# Patient Record
Sex: Male | Born: 1970 | Race: White | Hispanic: No | Marital: Married | Smoking: Current every day smoker
Health system: Southern US, Community
[De-identification: ages and names within clinical notes are randomized; demographics above are authoritative.]

## PROBLEM LIST (undated history)

## (undated) DIAGNOSIS — C61 Malignant neoplasm of prostate: Secondary | ICD-10-CM

## (undated) DIAGNOSIS — N35919 Unspecified urethral stricture, male, unspecified site: Secondary | ICD-10-CM

## (undated) DIAGNOSIS — M5430 Sciatica, unspecified side: Secondary | ICD-10-CM

## (undated) HISTORY — PX: PROSTATE BIOPSY: SHX241

---

## 2017-03-04 ENCOUNTER — Encounter: Payer: Self-pay | Admitting: Radiation Oncology

## 2017-03-15 ENCOUNTER — Encounter: Payer: Self-pay | Admitting: Radiation Oncology

## 2017-03-15 NOTE — Progress Notes (Signed)
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 Pain improves with activity. Taking oxycontin/oxycodone with moderate effect.   If Spine Met(s), symptoms, if any, include:  Bowel/Bladder retention or incontinence (please describe): Self caths 5 x per day, incomplete emptying, frequency, weak stream, straining, nocturia and occasional hematuria. ED related to ADT.  Numbness or weakness in extremities (please describe):   Current Decadron regimen, if applicable:   Ambulatory status? Walker? Wheelchair?: Ambulatory  SAFETY ISSUES:  Prior radiation? no  Pacemaker/ICD? no  Possible current pregnancy? no  Is the patient on methotrexate? no  Current Complaints / other details:  46 year old male. 4 kids. Married. Current everyday smoker.

## 2017-03-18 ENCOUNTER — Ambulatory Visit
Admission: RE | Admit: 2017-03-18 | Discharge: 2017-03-18 | Disposition: A | Discharge status: Home / Self Care | Payer: 59 | Source: Ambulatory Visit | Attending: Radiation Oncology | Admitting: Radiation Oncology

## 2017-03-18 ENCOUNTER — Inpatient Hospital Stay
Admission: RE | Admit: 2017-03-18 | Discharge: 2017-03-18 | Disposition: A | Discharge status: Home / Self Care | Payer: 59 | Source: Ambulatory Visit | Attending: Radiation Oncology | Admitting: Radiation Oncology

## 2017-03-18 ENCOUNTER — Ambulatory Visit: Payer: 59

## 2017-03-18 ENCOUNTER — Telehealth: Payer: Self-pay | Admitting: Radiation Oncology

## 2017-03-18 HISTORY — DX: Malignant neoplasm of prostate: C61

## 2017-03-18 HISTORY — DX: Unspecified urethral stricture, male, unspecified site: N35.919

## 2017-03-18 HISTORY — DX: Sciatica, unspecified side: M54.30

## 2017-03-18 NOTE — Progress Notes (Signed)
No Show

## 2017-03-18 NOTE — Telephone Encounter (Signed)
Patient did not show for 0930 nurse evaluation prior to consult. Phoned patient several times. No answer any of those times. After the third call I left a message requesting a return call to reschedule. Provided my contact information. Informed Dr. Tammi Klippel of such.

## 2017-03-28 ENCOUNTER — Telehealth: Payer: Self-pay | Admitting: Radiation Oncology

## 2017-03-28 NOTE — Telephone Encounter (Signed)
Kevin patient today to inquire about rescheduling missed consult appointment. Patient reports he hasn't followed up with Dr. Dwana Curd yet and until he does so he doesn't want to reschedule. Assured patient we would like to help. Provided my contact information should he like to reschedule.

## 2017-03-28 NOTE — Telephone Encounter (Signed)
-----   Message from Tyler Pita, MD sent at 03/28/2017 12:23 PM EDT ----- Has anyone rescheduled this no-show?  (Please reply and put an update in EPIC on the status)

## 2017-03-28 NOTE — Telephone Encounter (Signed)
Thanks Sam, We can wait and reschedule if he or Dr. Dwana Curd calls back. MM

## 2017-10-16 ENCOUNTER — Encounter: Payer: Self-pay | Admitting: Radiation Oncology

## 2017-10-22 ENCOUNTER — Other Ambulatory Visit: Payer: Self-pay | Admitting: Radiation Oncology

## 2017-10-22 ENCOUNTER — Ambulatory Visit
Admission: RE | Admit: 2017-10-22 | Discharge: 2017-10-22 | Disposition: A | Discharge status: Home / Self Care | Payer: Self-pay | Source: Ambulatory Visit | Attending: Radiation Oncology | Admitting: Radiation Oncology

## 2017-10-22 DIAGNOSIS — C61 Malignant neoplasm of prostate: Secondary | ICD-10-CM

## 2017-10-24 ENCOUNTER — Other Ambulatory Visit: Payer: Self-pay

## 2017-10-24 ENCOUNTER — Encounter: Payer: Self-pay | Admitting: Radiation Oncology

## 2017-10-24 ENCOUNTER — Ambulatory Visit
Admission: RE | Admit: 2017-10-24 | Discharge: 2017-10-24 | Disposition: A | Discharge status: Home / Self Care | Payer: 59 | Source: Ambulatory Visit | Attending: Radiation Oncology | Admitting: Radiation Oncology

## 2017-10-24 ENCOUNTER — Encounter: Payer: Self-pay | Admitting: Medical Oncology

## 2017-10-24 VITALS — BP 146/73 | HR 70 | Temp 98.2°F | Resp 18 | Wt 216.4 lb

## 2017-10-24 DIAGNOSIS — R200 Anesthesia of skin: Secondary | ICD-10-CM | POA: Diagnosis not present

## 2017-10-24 DIAGNOSIS — F17210 Nicotine dependence, cigarettes, uncomplicated: Secondary | ICD-10-CM | POA: Insufficient documentation

## 2017-10-24 DIAGNOSIS — M25552 Pain in left hip: Secondary | ICD-10-CM | POA: Insufficient documentation

## 2017-10-24 DIAGNOSIS — Z79899 Other long term (current) drug therapy: Secondary | ICD-10-CM | POA: Insufficient documentation

## 2017-10-24 DIAGNOSIS — Z9221 Personal history of antineoplastic chemotherapy: Secondary | ICD-10-CM | POA: Insufficient documentation

## 2017-10-24 DIAGNOSIS — R102 Pelvic and perineal pain: Secondary | ICD-10-CM | POA: Insufficient documentation

## 2017-10-24 DIAGNOSIS — Z801 Family history of malignant neoplasm of trachea, bronchus and lung: Secondary | ICD-10-CM | POA: Insufficient documentation

## 2017-10-24 DIAGNOSIS — C7951 Secondary malignant neoplasm of bone: Secondary | ICD-10-CM | POA: Insufficient documentation

## 2017-10-24 DIAGNOSIS — Z8546 Personal history of malignant neoplasm of prostate: Secondary | ICD-10-CM | POA: Diagnosis not present

## 2017-10-24 DIAGNOSIS — C61 Malignant neoplasm of prostate: Secondary | ICD-10-CM

## 2017-10-24 NOTE — Progress Notes (Signed)
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 Malignant neoplasm of prostate ([place]) C61   2. Prostate cancer metastatic to bone [facility]) C61    C79.51     HISTORY OF PRESENT ILLNESS: Kevin Hurley is a 47 y.o. male seen at the request of Dr. Aline Brochure for metastatic prostate cancer. The patient was initially diagnosed with metastatic prostate cancer in 2016 when he presented to ED at [facility] on 12/16/14 for pelvic pain and urinary retention. Per patient report, he was found to have severe urethral stricture, attributing to the BOO sxs.  He underwent a bone scan which revealed uptake at the right [facility] joint, right T8, L3-4 and left ilium. He underwent a transurethral resection of the prostate on 12/20/14 which showed Gleason's 4+5 pro acinar adenocarcinoma. The patient was started on ADT with CAB in June 2016. His PSA at this time was 0.33 and a repeat PSA in 01/2015 was 0.13. He completed 6 cycles of Docetaxel on 04/19/15 and by 05/30/15, his PSA was less than 0.03. The PSA gradually increased to 0.06 on 10/21/15 despite continuing on CAB with Lupron 22.5 mg every 3 months and Casodex 50 mg daily. On 01/24/16, the PSA increased to 0.11 and abiraterone 1000 mg+ prednisone 5 mg was added to his regimen.  A CT abdomen pelvis and bone scan in June 2018 shows disease progression with increased size of a left pulmonary nodule as well as a new left posterior bladder focus which was enhancing and increased osseous metastatic disease.  He started Docetaxel x 10 cycles in 03/15/2017 with PSA 1.85.  He met with Dr. Dwana Curd in radiation oncology on  02/28/17 who recommended 30 Gy in 10 fractions to the left pelvis over 2 weeks and 50 Gy to the prostate over the course of 4 weeks, after chemo is completed.  PSA 05/17/17 was 1.17 but back up to 1.29 06/07/17.  He completed 10 cycles of Docetaxel on 09/20/17.  Repeat systemic imaging 10/11/17 demonstrated overall disease stability aside for a slightly enlarged LLL pulmonary nodule.  He has continued with persistent and progressive pain in the left hip/groin but denies other specific sites of bony pain.  The patient has been referred by Dr. Aline Brochure for consideration of 30 Gy to the left pelvis over 2 weeks and 50 Gy in 4 weeks to the prostate after debulking TURP.  He is scheduled to meet with Dr. Arlyn Leak, [facility], on 11/25/2017.  He has a history of urethral stricture requiring daily CIC.  His LUTS have actually improved since completion of chemotherapy and he is now self catheterizing less frequently.  His fear at this point is that proceeding with repeat TURP might potentially cause bladder incontinence which would be a deal breaker for him as he would much rather deal with CIC prn.  Nonetheless, he is still planning to meet with Dr. Arlyn Leak to discuss debulking TURP and get his expert opinion regarding the risks and potential side effects associated with this treatment.  The patient currently complains of persistent left hip/pelvic pain since diagnosis. This is worse with sitting. He notes that this improves somewhat with  activity. He is taking OxyContin/Oxycodone with moderate effect. He grades his left hip pain as 3/10. As of 10/14/17 his dose of Prednisone has increased from 5 mg to 10 mg.  PREVIOUS RADIATION THERAPY: No  PAST MEDICAL HISTORY:  Past Medical History:  Diagnosis Date  . Prostate cancer ([place])   . Sciatica   . Urethral stricture       PAST SURGICAL HISTORY: Past Surgical History:  Procedure Laterality Date  . PROSTATE BIOPSY      FAMILY HISTORY:  Family History    Problem Relation Age of Onset  . Cancer Father        lung cancer/quit smoking numerous years before diagnosis  . Healthy Brother   . Cancer Maternal Aunt        brain cancer  . Cancer Maternal Grandfather        throat cancer/smoker    SOCIAL HISTORY:  Social History   Socioeconomic History  . Marital status: Married    Spouse name: Not on file  . Number of children: 4  . Years of education: Not on file  . Highest education level: Not on file  Occupational History  . Occupation: [employer]    Comment: Tree surgeon  Social Needs  . Financial resource strain: Not on file  . Food insecurity:    Worry: Not on file    Inability: Not on file  . Transportation needs:    Medical: Not on file    Non-medical: Not on file  Tobacco Use  . Smoking status: Current Every Day Smoker    Packs/day: 1.00    Years: 20.00    Pack years: 20.00    Types: Cigarettes  . Smokeless tobacco: Never Used  Substance and Sexual Activity  . Alcohol use: Not Currently  . Drug use: No  . Sexual activity: Never  Lifestyle  . Physical activity:    Days per week: Not on file    Minutes per session: Not on file  . Stress: Not on file  Relationships  . Social connections:    Talks on phone: Not on file    Gets together: Not on file    Attends religious service: Not on file    Active member of club or organization: Not on file    Attends meetings of clubs or organizations: Not on file    Relationship status: Not on file  . Intimate partner violence:    Fear of current or ex partner: Not on file    Emotionally abused: Not on file    Physically abused: Not on file    Forced sexual activity: Not on file  Other Topics Concern  . Not on file  Social History Narrative   Resides in [place], [place] at the [employer]. From [place]. Oldest son is an Chief Financial Officer who lives in [place] with his wife and son. Patient has four children total. 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     ALLERGIES:  Other  MEDICATIONS:  Current Outpatient Medications  Medication Sig Dispense Refill  . loratadine (CLARITIN) 10 MG tablet Take 10 mg by mouth daily.    Marland Kitchen oxyCODONE (OXYCONTIN) 10 mg 12 hr tablet Take 10 mg by mouth every 12 (twelve) hours.    . Oxycodone HCl 10 MG TABS TAKE 1 TABLET (10 MG TOTAL) BY MOUTH EVERY 4 (FOUR) HOURS AS NEEDED FOR PAIN  0  . predniSONE (DELTASONE) 10 MG tablet Take 10 mg by mouth daily with breakfast.    .  ranitidine (ZANTAC) 150 MG tablet Take by mouth.    . tamsulosin (FLOMAX) 0.4 MG CAPS capsule Take 0.4 mg by mouth.     No current facility-administered medications for this encounter.     REVIEW OF SYSTEMS:  On review of systems, the patient reports that he is doing well overall. He denies any chest pain, shortness of breath, cough, fevers, chills, night sweats, unintended weight changes. He reports incomplete emptying, frequency, weak stream, rare straining, and nocturia. He denies any bowel disturbances, and denies abdominal pain, nausea or vomiting. He reports persistent pelvic pain and pain ot the left hip. He also complains of occasional numbness in the left calf. A complete review of systems is obtained and is otherwise negative.    PHYSICAL EXAM:  Wt Readings from Last 3 Encounters:  10/24/17 216 lb 6.4 oz (98.2 kg)   Temp Readings from Last 3 Encounters:  10/24/17 98.2 F (36.8 C) (Oral)   BP Readings from Last 3 Encounters:  10/24/17 (!) 146/73   Pulse Readings from Last 3 Encounters:  10/24/17 70   Pain Assessment Pain Score: 3  Pain Loc: Hip/10  In general this is a well appearing caucasian gentleman in no acute distress. He is alert and oriented x4 and appropriate throughout the examination. HEENT reveals that the patient is normocephalic, atraumatic. EOMs are intact. PERRLA. Skin is intact without any evidence of gross lesions. Cardiovascular exam reveals a regular rate and rhythm, no clicks rubs or murmurs are auscultated. Chest is clear  to auscultation bilaterally. Lymphatic assessment is performed and does not reveal any adenopathy in the cervical, supraclavicular, axillary, or inguinal chains. Abdomen has active bowel sounds in all quadrants and is intact. The abdomen is soft, non tender, non distended. Lower extremities are negative for pretibial pitting edema, deep calf tenderness, cyanosis or clubbing.   KPS = 100  100 - Normal; no complaints; no evidence of disease. 90   - Able to carry on normal activity; minor signs or symptoms of disease. 80   - Normal activity with effort; some signs or symptoms of disease. 2   - Cares for self; unable to carry on normal activity or to do active work. 60   - Requires occasional assistance, but is able to care for most of his personal needs. 50   - Requires considerable assistance and frequent medical care. 67   - Disabled; requires special care and assistance. 10   - Severely disabled; hospital admission is indicated although death not imminent. 21   - Very sick; hospital admission necessary; active supportive treatment necessary. 10   - Moribund; fatal processes progressing rapidly. 0     - Dead  Karnofsky DA, Abelmann WH, Craver LS and Burchenal JH [phone]) The use of the nitrogen mustards in the palliative treatment of carcinoma: with particular reference to bronchogenic carcinoma Cancer 1 [id number]  LABORATORY DATA:  No results found for: WBC, HGB, HCT, MCV, PLT No results found for: NA, K, CL, CO2 No results found for: ALT, AST, GGT, ALKPHOS, BILITOT   RADIOGRAPHY: No results found.    IMPRESSION/PLAN: 1. 47 y.o. gentleman with diffusly metastatic prostate cancer with painful metastasis to the left hip.  Today, we talked to the patient about the findings and work-up thus far. We discussed the natural history of metastatic prostate cancer and general treatment, highlighting the role of radiotherapy in the management. We discussed the course of palliative radiotherapy to the left  hip now , and consider a course of  4 weeks palliative radiotherapy to the prostate following TURP for debulking should he opt to proceed with surgical debulking.  We would offer palliative radiotherapy to the prostate regardless of whether he decides to proceed with TURP or not and certainly encourage him to proceed with surgical debulking as this will increase the likelihood of getting more durable control of the prostate cancer.  We discussed the available radiation techniques, and focused on the details of logistics and delivery. We reviewed the anticipated acute and late sequelae associated with radiation in this setting. The patient was encouraged to ask questions that were answered to the best of our ability. The patient will meet with Dr. Arlyn Leak at [facility] to decide if he will proceed with TURP prior to radiation.   At the conclusion of our conversation, the patient elects to proceed with palliative radiation to the left hip and will be scheduled for CT simulation at 1pm on Friday 10/25/17. He has freely signed written consent to proceed today in the office and a copy of this document is placed in his medical record. The recommendation is for 30 Gy in 10 fractions to the left hip delivered over the course of 2 weeks and we anticipate beginning treatment Monday, 10/28/17.     We will plan to meet back with him in the office following his meeting with Dr. Arlyn Leak and will make further arrangements for palliative prostate radiotherapy accordingly at that time pending his decision to move forward with TURP.  I spent 60 minutes minutes face to face with the patient and more than 50% of that time was spent in counseling and/or coordination of care.    Nicholos Johns, PA-C    Tyler Pita, MD  [facility] Direct Dial: [phone]  Fax: [phone] [email or web address]  Skype  [email or web address]   This document serves as a record of services personally performed by Tyler Pita, MD and  Freeman Caldron, PA-C. It was created on their behalf by Bethann Humble, a trained medical scribe. The creation of this record is based on the scribe's personal observations and the provider's statements to them. This document has been checked and approved by the attending provider.

## 2017-10-24 NOTE — Progress Notes (Signed)
Diagnosed in 2016 in [place]. He received treatment with Dr. Marlowe Sax. He relocated to [facility] 2017 and is currently under the care of Dr. Lindaann Slough. He was referred here for radiation with Dr. Tammi Klippel.

## 2017-10-24 NOTE — Progress Notes (Signed)
Introduced myself to Mr. Kevin Hurley as the prostate nurse navigator and my role. He informs me he was diagnosed with prostate cancer in 2016 and has received chemo and Lupron. He completed 10 cycles of chemo in February and is looking forward to getting his energy back. He shared his feelings and frustration of chemo, fatigue, hair loss, and now facing radiation.He also voiced concerns over having a TURP and being incontinent. He is ready to move forward with radiation and hopes to be able to return to playing tennis in the future. I informed him of our support services here at [facility] that can help support him thru his journey. I gave him my card and asked him to call me with questions or concerns.

## 2017-10-24 NOTE — Progress Notes (Signed)
Histology and Location of Primary Cancer: metastatic prostate cancer  Sites of Visceral and Bony Metastatic Disease: R AC joint, T8, L3-4, left ilium  Location(s) of Symptomatic Metastases: left ilium  Past/Anticipated chemotherapy by medical oncology, if any: 10 cycles of docetaxel (last one 5 weeks ago/end of February). Lupron 22.5 given 09/20/2017.  Pain on a scale of 0-10 is: Left hip, 3/10 and aching. He notes persistent pelvic pain since diagnosis particularly with sitting. He reports the pain improves with activity. He takes oxycontin/oxycodone with moderate effect. He still works and plays tennis despite pain???    If Spine Met(s), symptoms, if any, include:  Bowel/Bladder retention or incontinence (please describe): not having to cath himself as frequently at night since chemotherapy. Currently taking Flomax. Reports incomplete emptying, frequency, weak stream, straining (rarely), nocturia. Denies hematuria x several months.  Numbness or weakness in extremities (please describe): reports occasional numbness in left calf  Current Decadron regimen, if applicable: increased dose of prednisone to 10 mg daily from 5 mg on 10/14/2017.  Ambulatory status? Walker? Wheelchair?: Ambulatory  SAFETY ISSUES:  Prior radiation? no  Pacemaker/ICD? no  Possible current pregnancy? no  Is the patient on methotrexate? no  Current Complaints / other details:  47 year old male. Married. 4 children (all four are male). 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 Patient's father with hx of lung ca and maternal grandfather with hx of throat ca.  PSA increasing. Most recent PSA 3.95 up from 3.07.  Referred to Dr. Tammi Klippel from Dr. Aline Brochure for consideration of 30 Gy to the left pelvis over 2 weeks and 50 Gy in 4 weeks to the prostate after TURP/chemotherapy is complete. Patient reports he will not have a TURP due to fear of incontinence. Reports fatigue. Expresses frustration with  alopecia.

## 2017-10-25 ENCOUNTER — Ambulatory Visit
Admission: RE | Admit: 2017-10-25 | Discharge: 2017-10-25 | Disposition: A | Discharge status: Home / Self Care | Payer: 59 | Source: Ambulatory Visit | Attending: Radiation Oncology | Admitting: Radiation Oncology

## 2017-10-25 DIAGNOSIS — C7951 Secondary malignant neoplasm of bone: Secondary | ICD-10-CM | POA: Insufficient documentation

## 2017-10-25 DIAGNOSIS — C61 Malignant neoplasm of prostate: Secondary | ICD-10-CM | POA: Insufficient documentation

## 2017-10-25 NOTE — Progress Notes (Deleted)
Received patient in the clinic following SRS treatment. Will monitor patient for a minimum of thirty minutes. Patient alert and oriented x 3. No distress noted. Patient denies headache, dizziness, nausea, diplopia or ringing in the ears. Patient confirms he is taking prednisone 10 mg once daily. No evidence of thrush noted in his mouth. Patient understands to avoid strenuous activity for the next 24 hours. Patient understands to phone [phone] and request the radiation oncologist on call if he experiences any neurologic changes over the weekend. Patient discharged home. Ambulatory with the aid of a cane and in no distress.  BP 135/80 (BP Location: Right Arm, Patient Position: Sitting, Cuff Size: Normal)   Pulse 74   Temp 98.1 F (36.7 C) (Oral)   Resp 18   SpO2 100%  .kh                

## 2017-10-27 NOTE — Progress Notes (Addendum)
  Radiation Oncology         (336) [phone] ________________________________  Name: Kevin Hurley MRN: [id number]  Date: 10/25/2017  DOB: 03/08/1971  SIMULATION AND TREATMENT PLANNING NOTE    ICD-10-CM   1. Prostate cancer metastatic to bone [facility]) C61    C79.51     DIAGNOSIS:  47 year old gentleman with diffusely metastatic prostate cancer with painful bony metastasis to the left hip  NARRATIVE:  The patient was brought to the [facility].  Identity was confirmed.  All relevant records and images related to the planned course of therapy were reviewed.  The patient freely provided informed written consent to proceed with treatment after reviewing the details related to the planned course of therapy. The consent form was witnessed and verified by the simulation staff.  Then, the patient was set-up in a stable reproducible  supine position for radiation therapy.  CT images were obtained.  Surface markings were placed.  The CT images were loaded into the planning software.  Then the target and avoidance structures were contoured.  Treatment planning then occurred.  The radiation prescription was entered and confirmed.  Then, I designed and supervised the construction of a total of 3 medically necessary complex treatment devices BodyFix positioner and 2 MLC's.  I have requested : Isodose Plan.   PLAN:  The patient will receive 30 Gy in 10 fractions.  ________________________________  Sheral Apley Tammi Klippel, M.D.

## 2017-10-28 DIAGNOSIS — C61 Malignant neoplasm of prostate: Secondary | ICD-10-CM | POA: Insufficient documentation

## 2017-10-28 DIAGNOSIS — Z510 Encounter for antineoplastic radiation therapy: Secondary | ICD-10-CM | POA: Insufficient documentation

## 2017-10-29 DIAGNOSIS — Z510 Encounter for antineoplastic radiation therapy: Secondary | ICD-10-CM | POA: Diagnosis not present

## 2017-10-29 DIAGNOSIS — C61 Malignant neoplasm of prostate: Secondary | ICD-10-CM | POA: Diagnosis present

## 2017-10-31 ENCOUNTER — Ambulatory Visit
Admission: RE | Admit: 2017-10-31 | Discharge: 2017-10-31 | Disposition: A | Discharge status: Home / Self Care | Payer: 59 | Source: Ambulatory Visit | Attending: Radiation Oncology | Admitting: Radiation Oncology

## 2017-10-31 ENCOUNTER — Encounter: Payer: Self-pay | Admitting: Medical Oncology

## 2017-10-31 DIAGNOSIS — C61 Malignant neoplasm of prostate: Secondary | ICD-10-CM | POA: Diagnosis not present

## 2017-11-01 ENCOUNTER — Ambulatory Visit
Admission: RE | Admit: 2017-11-01 | Discharge: 2017-11-01 | Disposition: A | Discharge status: Home / Self Care | Payer: 59 | Source: Ambulatory Visit | Attending: Radiation Oncology | Admitting: Radiation Oncology

## 2017-11-01 DIAGNOSIS — C61 Malignant neoplasm of prostate: Secondary | ICD-10-CM | POA: Diagnosis not present

## 2017-11-04 ENCOUNTER — Ambulatory Visit
Admission: RE | Admit: 2017-11-04 | Discharge: 2017-11-04 | Disposition: A | Discharge status: Home / Self Care | Payer: 59 | Source: Ambulatory Visit | Attending: Radiation Oncology | Admitting: Radiation Oncology

## 2017-11-04 ENCOUNTER — Ambulatory Visit: Payer: 59 | Admitting: Radiation Oncology

## 2017-11-04 ENCOUNTER — Ambulatory Visit: Payer: 59

## 2017-11-04 DIAGNOSIS — C61 Malignant neoplasm of prostate: Secondary | ICD-10-CM | POA: Diagnosis not present

## 2017-11-05 ENCOUNTER — Ambulatory Visit
Admission: RE | Admit: 2017-11-05 | Discharge: 2017-11-05 | Disposition: A | Discharge status: Home / Self Care | Payer: 59 | Source: Ambulatory Visit | Attending: Radiation Oncology | Admitting: Radiation Oncology

## 2017-11-05 DIAGNOSIS — C61 Malignant neoplasm of prostate: Secondary | ICD-10-CM | POA: Diagnosis not present

## 2017-11-06 ENCOUNTER — Ambulatory Visit
Admission: RE | Admit: 2017-11-06 | Discharge: 2017-11-06 | Disposition: A | Discharge status: Home / Self Care | Payer: 59 | Source: Ambulatory Visit | Attending: Radiation Oncology | Admitting: Radiation Oncology

## 2017-11-06 DIAGNOSIS — C61 Malignant neoplasm of prostate: Secondary | ICD-10-CM | POA: Diagnosis not present

## 2017-11-07 ENCOUNTER — Ambulatory Visit
Admission: RE | Admit: 2017-11-07 | Discharge: 2017-11-07 | Disposition: A | Discharge status: Home / Self Care | Payer: 59 | Source: Ambulatory Visit | Attending: Radiation Oncology | Admitting: Radiation Oncology

## 2017-11-07 DIAGNOSIS — C61 Malignant neoplasm of prostate: Secondary | ICD-10-CM | POA: Diagnosis not present

## 2017-11-08 ENCOUNTER — Ambulatory Visit
Admission: RE | Admit: 2017-11-08 | Discharge: 2017-11-08 | Disposition: A | Discharge status: Home / Self Care | Payer: 59 | Source: Ambulatory Visit | Attending: Radiation Oncology | Admitting: Radiation Oncology

## 2017-11-08 DIAGNOSIS — C61 Malignant neoplasm of prostate: Secondary | ICD-10-CM | POA: Diagnosis not present

## 2017-11-11 ENCOUNTER — Ambulatory Visit
Admission: RE | Admit: 2017-11-11 | Discharge: 2017-11-11 | Disposition: A | Discharge status: Home / Self Care | Payer: 59 | Source: Ambulatory Visit | Attending: Radiation Oncology | Admitting: Radiation Oncology

## 2017-11-11 DIAGNOSIS — C61 Malignant neoplasm of prostate: Secondary | ICD-10-CM | POA: Diagnosis not present

## 2017-11-12 ENCOUNTER — Ambulatory Visit
Admission: RE | Admit: 2017-11-12 | Discharge: 2017-11-12 | Disposition: A | Discharge status: Home / Self Care | Payer: 59 | Source: Ambulatory Visit | Attending: Radiation Oncology | Admitting: Radiation Oncology

## 2017-11-12 DIAGNOSIS — C61 Malignant neoplasm of prostate: Secondary | ICD-10-CM | POA: Diagnosis not present

## 2017-11-13 ENCOUNTER — Encounter: Payer: Self-pay | Admitting: Medical Oncology

## 2017-11-13 ENCOUNTER — Encounter: Payer: Self-pay | Admitting: Radiation Oncology

## 2017-11-13 ENCOUNTER — Ambulatory Visit
Admission: RE | Admit: 2017-11-13 | Discharge: 2017-11-13 | Disposition: A | Discharge status: Home / Self Care | Payer: 59 | Source: Ambulatory Visit | Attending: Radiation Oncology | Admitting: Radiation Oncology

## 2017-11-13 DIAGNOSIS — C61 Malignant neoplasm of prostate: Secondary | ICD-10-CM | POA: Diagnosis not present

## 2017-11-14 NOTE — Progress Notes (Signed)
  Radiation Oncology         (336) [phone] ________________________________  Name: Kevin Hurley MRN: [id number]  Date: 11/13/2017  DOB: Jan 06, 1971  End of Treatment Note  Diagnosis:   47 y.o. male with diffusely metastatic prostate cancer with painful bony metastasis to the left hip    Indication for treatment:  Palliative       Radiation treatment dates:   10/31/2017 - 11/13/2017  Site/dose:   Left Hip / 30 Gy in 10 fractions  Beams/energy:   Isodose Plan / 15X Photon  Narrative: The patient tolerated radiation treatment relatively well and noted improvement of pain in the treated area by completion of treatment.  No significant difficulties with treatment.   Plan: The patient has completed radiation treatment. The patient will return to radiation oncology clinic for routine followup in one month. I advised him to call or return sooner if he has any questions or concerns related to his recovery or treatment. ________________________________  Sheral Apley. Tammi Klippel, M.D.  This document serves as a record of services personally performed by Tyler Pita, MD. It was created on his behalf by Rae Lips, a trained medical scribe. The creation of this record is based on the scribe's personal observations and the provider's statements to them. This document has been checked and approved by the attending provider.

## 2018-01-29 ENCOUNTER — Encounter: Payer: Self-pay | Admitting: Radiation Oncology

## 2018-01-29 NOTE — Progress Notes (Signed)
Radiation Oncology         (336) [phone] ________________________________  Initial outpatient Consultation  Name: Kevin Hurley MRN: [id number]  Date of Service: 01/31/2018 DOB: 03-28-1971  XB:[id number], Pcp Not In  Esaw Dace, MD   REFERRING PHYSICIAN: Esaw Dace, MD  DIAGNOSIS: 47 y.o. 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    ICD-10-CM   1. Prostate cancer metastatic to bone ([place]) C61    C79.51   2. 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 male seen at the request of Dr. Aline Brochure for metastatic prostate cancer. The patient was initially diagnosed with metastatic prostate cancer in 2016 when he presented to ED at [facility] on 12/16/14 for pelvic pain and urinary retention. Per patient report, he was found to have severe urethral stricture, attributing to the BOO sxs.  He underwent a bone scan which revealed uptake at the right [facility] joint, right T8, L3-4 and left ilium. He underwent a transurethral resection of the prostate on 12/20/14 which showed Gleason's 4+5 pro acinar adenocarcinoma. The patient was started on ADT with CAB in June 2016. His PSA at this time was 0.33 and a repeat PSA in 01/2015 was 0.13. He completed 6 cycles of Docetaxel on 04/19/15 and by 05/30/15, his PSA was less than 0.03. The PSA gradually increased to 0.06 on 10/21/15 despite continuing on CAB with Lupron 22.5 mg every 3 months and Casodex 50 mg daily. On 01/24/16, the PSA increased to 0.11 and abiraterone 1000 mg+ prednisone 5 mg was added to his regimen.  A CT abdomen pelvis and bone scan in June 2018 shows disease progression with increased size of a left pulmonary nodule as well as a new left posterior bladder focus which was enhancing and increased osseous metastatic disease.  He started Docetaxel x 10 cycles in 03/15/2017 with PSA 1.85.  He met with Dr. Dwana Curd in radiation oncology on 02/28/17 who recommended  30 Gy in 10 fractions to the left pelvis over 2 weeks and 50 Gy to the prostate over the course of 4 weeks, after chemo is completed.  PSA 05/17/17 was 1.17 but back up to 1.29 06/07/17.  He completed 10 cycles of Docetaxel on 09/20/17.  Repeat systemic imaging 10/11/17 demonstrated overall disease stability aside for a slightly enlarged LLL pulmonary nodule.  He has continued with persistent and progressive pain in the left hip/groin but denies other specific sites of bony pain.  The patient has been referred by Dr. Aline Brochure for consideration of 30 Gy to the left pelvis over 2 weeks and 50 Gy in 4 weeks to the prostate after debulking TURP.  He is scheduled to meet with Dr. Arlyn Leak, [facility], on 11/25/2017.  He has a history of urethral stricture requiring daily CIC.  His LUTS have actually improved since completion of chemotherapy and he is now self catheterizing less frequently.  His fear at this point is that proceeding with repeat TURP might potentially cause bladder incontinence which would be a deal breaker for him as he would much rather deal with CIC prn.  Nonetheless, he is still planning to meet with Dr. Arlyn Leak to discuss debulking TURP and get his expert opinion regarding the risks and potential side effects associated with this treatment.  He had MRI 01/15/18 showing T12 spinal metastasis with cord compression.  He underwent T11-L1 laminectomy and fusion, Kyphoplasty on 6/21 and has been referred for palliative radiotherapy  to the spine.  Today, he is accompanied by his wife. The patient is doing well, however, does report a lot of back pain that is worse when supine. Prior to the surgery he was unable to sit, or lay down. Standing was the most comfortable for him. Per his wife, he would sleep standing up holding onto the couch. Status post he is able to sit down without discomfort. After surgery he endorses numbness on the bottom of his feet, numbness along his waist band area and numbness to  the side of is left lower leg. He has not gone back to work since his emergency surgery.   PREVIOUS RADIATION THERAPY: Yes 10/31/2017 - 11/13/2017  PAST MEDICAL HISTORY:  Past Medical History:  Diagnosis Date  . Prostate cancer ([place])   . Sciatica   . Urethral stricture       PAST SURGICAL HISTORY: Past Surgical History:  Procedure Laterality Date  . PROSTATE BIOPSY      FAMILY HISTORY:  Family History  Problem Relation Age of Onset  . Cancer Father        lung cancer/quit smoking numerous years before diagnosis  . Healthy Brother   . Cancer Maternal Aunt        brain cancer  . Cancer Maternal Grandfather        throat cancer/smoker    SOCIAL HISTORY:  Social History   Socioeconomic History  . Marital status: Married    Spouse name: Not on file  . Number of children: 4  . Years of education: Not on file  . Highest education level: Not on file  Occupational History  . Occupation: [employer]    Comment: Tree surgeon  Social Needs  . Financial resource strain: Not on file  . Food insecurity:    Worry: Not on file    Inability: Not on file  . Transportation needs:    Medical: Not on file    Non-medical: Not on file  Tobacco Use  . Smoking status: Current Every Day Smoker    Packs/day: 1.00    Years: 20.00    Pack years: 20.00    Types: Cigarettes  . Smokeless tobacco: Never Used  Substance and Sexual Activity  . Alcohol use: Not Currently  . Drug use: No  . Sexual activity: Never  Lifestyle  . Physical activity:    Days per week: Not on file    Minutes per session: Not on file  . Stress: Not on file  Relationships  . Social connections:    Talks on phone: Not on file    Gets together: Not on file    Attends religious service: Not on file    Active member of club or organization: Not on file    Attends meetings of clubs or organizations: Not on file    Relationship status: Not on file  . Intimate partner violence:    Fear of current or ex partner: Not on  file    Emotionally abused: Not on file    Physically abused: Not on file    Forced sexual activity: Not on file  Other Topics Concern  . Not on file  Social History Narrative   Resides in [place], [place] at the [employer]. From [place]. Oldest son is an Chief Financial Officer who lives in [place] with his wife and son. Patient has four children total. 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     ALLERGIES: Other  MEDICATIONS:  Current Outpatient Medications  Medication Sig Dispense  Refill  . cyclobenzaprine (FLEXERIL) 5 MG tablet Take by mouth.    . gabapentin (NEURONTIN) 300 MG capsule Take by mouth.    . oxyCODONE (OXYCONTIN) 60 MG 12 hr tablet Take by mouth.    . predniSONE (DELTASONE) 10 MG tablet TAKE 1 TABLET BY MOUTH EVERY DAY    . senna-docusate (SENOKOT-S) 8.6-50 MG tablet Take by mouth.    . tamsulosin (FLOMAX) 0.4 MG CAPS capsule TAKE ONE CAPSULE BY MOUTH TWICE A DAY AFTER SAME MEAL EACH DAY    . loratadine (CLARITIN) 10 MG tablet Take 10 mg by mouth daily.    . ranitidine (ZANTAC) 150 MG tablet Take by mouth.     No current facility-administered medications for this encounter.     REVIEW OF SYSTEMS:  On review of systems, the patient reports that he is doing well overall. He denies any chest pain, shortness of breath, cough, fevers, chills, night sweats, unintended weight changes. He reports back pain. He denies any bowel disturbances, and denies abdominal pain, nausea or vomiting. He reports numbness to the bottom of his feet, waist band area, and the outer side of left lower leg. A complete review of systems is obtained and is otherwise negative.     PHYSICAL EXAM:  Wt Readings from Last 3 Encounters:  01/31/18 204 lb (92.5 kg)  01/31/18 204 lb (92.5 kg)  10/24/17 216 lb 6.4 oz (98.2 kg)   Temp Readings from Last 3 Encounters:  01/31/18 98.6 F (37 C) (Oral)  01/31/18 98.6 F (37 C)  10/24/17 98.2 F (36.8 C) (Oral)   BP Readings from Last 3 Encounters:  01/31/18 (!)  117/53  01/31/18 (!) 117/53  10/24/17 (!) 146/73   Pulse Readings from Last 3 Encounters:  01/31/18 86  01/31/18 86  10/24/17 70   Pain Assessment Pain Score: 0-No pain/10  In general this is a well appearing caucasian gentleman in no acute distress. He is alert and oriented x4 and appropriate throughout the examination. Ambulates without difficulty. HEENT reveals that the patient is normocephalic, atraumatic. EOMs are intact. PERRLA. Skin is intact without any evidence of gross lesions. Cardiovascular exam reveals a regular rate and rhythm, no clicks rubs or murmurs are auscultated. Chest is clear to auscultation bilaterally. Lymphatic assessment is performed and does not reveal any adenopathy in the cervical, supraclavicular, axillary, or inguinal chains. Abdomen has active bowel sounds in all quadrants and is intact. The abdomen is soft, non tender, non distended. Lower extremities are negative for pretibial pitting edema, deep calf tenderness, cyanosis or clubbing.    KPS = 80  100 - Normal; no complaints; no evidence of disease. 90   - Able to carry on normal activity; minor signs or symptoms of disease. 80   - Normal activity with effort; some signs or symptoms of disease. 39   - Cares for self; unable to carry on normal activity or to do active work. 60   - Requires occasional assistance, but is able to care for most of his personal needs. 50   - Requires considerable assistance and frequent medical care. 28   - Disabled; requires special care and assistance. 71   - Severely disabled; hospital admission is indicated although death not imminent. 73   - Very sick; hospital admission necessary; active supportive treatment necessary. 10   - Moribund; fatal processes progressing rapidly. 0     - Dead  Karnofsky DA, Abelmann WH, Craver LS and Burchenal [facility] [phone]) The use of the nitrogen  mustards in the palliative treatment of carcinoma: with particular reference to bronchogenic carcinoma  Cancer 1 [id number]  LABORATORY DATA:  No results found for: WBC, HGB, HCT, MCV, PLT No results found for: NA, K, CL, CO2 No results found for: ALT, AST, GGT, ALKPHOS, BILITOT   RADIOGRAPHY: No results found.    IMPRESSION/PLAN: 1. 47 y.o. 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 Today, we talked to the patient about the findings and work-up thus far. We discussed the natural history of metastatic prostate cancer and general treatment, highlighting the role of radiotherapy in the management. We discussed the course of palliative radiotherapy to the spine now  At the conclusion of our conversation, the patient elects to proceed with palliative radiation to the spine and will be scheduled for CT simulation today. He has freely signed written consent to proceed today in the office and a copy of this document is placed in his medical record. The recommendation is for 30 Gy in 10 fractions to the to T10 to L2 delivered over the course of 2 weeks   I spent 30 minutes minutes face to face with the patient and more than 50% of that time was spent in counseling and/or coordination of care.   ------------------------------------------------   Tyler Pita, MD [facility] Director and Director of Stereotactic Radiosurgery Direct Dial: [phone]  Fax: [phone] [email or web address]  Skype  [email or web address]   Page Me      This document serves as a record of services personally performed by Tyler Pita, MD. It was created on his behalf by Margit Banda, a trained medical scribe. The creation of this record is based on the scribe's personal observations and the provider's statements to them. This document has been checked and approved by the attending provider.

## 2018-01-29 NOTE — Progress Notes (Signed)
Histology and Location of Primary Cancer: metastatic prostate cancer  Sites of Visceral and Bony Metastatic Disease: wide spread bony mets  Location(s) of Symptomatic Metastases: T10-sacrum  Past/Anticipated chemotherapy by medical oncology, if any: 10 cycles of docetaxel (last one 5 weeks ago/end of February). Lupron 22.5 given 09/20/2017.  Hoping to start chemotherapy on March 07, 2018.  Pain on a scale of 0-10 is: Reports low back pain 3-4 on a scale of 0-10. Reports pain increases when he lay supine. Pain worse at night.  Reports taking Oxy ER 60 mg tid, Oxy IR 30 mg tablet every 3 hours, prednisone 10 mg, gabapentin, flexeril and cyclobenzaprine to manage pain.   If Spine Met(s), symptoms, if any, include:  Bowel/Bladder retention or incontinence (please describe): Continues self cath. Currently taking Flomax bid. Reports incomplete emptying, frequency, weak stream, straining (rarely), nocturia. Denies hematuria.   Numbness or weakness in extremities (please describe): reports occasional numbness in left calf, bottom of feet and around waist band.  Current Decadron regimen, if applicable: Prednisone 10 mg each morning.  Ambulatory status? Walker? Wheelchair?: Ambulatory  SAFETY ISSUES:  Prior radiation? Yes, left hip 30/10 10/31/2017-11/13/2017  Pacemaker/ICD? no  Possible current pregnancy? no  Is the patient on methotrexate? no  Current Complaints / other details:  47 year old male. Married. 4 children (all four are male). 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 Unfortunately, patient has been unable to work since emergency back surgery on 01/10/2018. Current everyday smoker. Patient's father with hx of lung ca and maternal grandfather with hx of throat ca.

## 2018-01-30 NOTE — Progress Notes (Signed)
  Radiation Oncology         (336) [phone] ________________________________  Name: Santanna Olenik MRN: [id number]  Date: 01/31/2018  DOB: 1971-07-07  SIMULATION AND TREATMENT PLANNING NOTE    ICD-10-CM   1. Prostate cancer metastatic to bone [facility]) C61    C79.51    DIAGNOSIS:  47 y.o. man with T12 spinal mets from prostate cancer s/p T11-L1 laminectomy and fusion, Kyphoplasty  NARRATIVE:  The patient was brought to the [facility].  Identity was confirmed.  All relevant records and images related to the planned course of therapy were reviewed.  The patient freely provided informed written consent to proceed with treatment after reviewing the details related to the planned course of therapy. The consent form was witnessed and verified by the simulation staff.  Then, the patient was set-up in a stable reproducible  supine position for radiation therapy.  CT images were obtained.  Surface markings were placed.  The CT images were loaded into the planning software.  Then the target and avoidance structures were contoured including kidneys.  Treatment planning then occurred.  The radiation prescription was entered and confirmed.  Then, I designed and supervised the construction of a total of 3 medically necessary complex treatment devices with VacLoc positioner and 2 MLCs to shield kidneys.  I have requested : 3D Simulation  I have requested a DVH of the following structures: Left Kidney, Right Kidney and target.  PLAN:  The patient will receive 30 Gy in 10 fractions to T11 through SI joints.  ________________________________  Sheral Apley. Tammi Klippel, M.D.

## 2018-01-31 ENCOUNTER — Other Ambulatory Visit: Payer: Self-pay

## 2018-01-31 ENCOUNTER — Encounter: Payer: Self-pay | Admitting: Radiation Oncology

## 2018-01-31 ENCOUNTER — Ambulatory Visit
Admission: RE | Admit: 2018-01-31 | Discharge: 2018-01-31 | Disposition: A | Discharge status: Home / Self Care | Payer: 59 | Source: Ambulatory Visit | Attending: Radiation Oncology | Admitting: Radiation Oncology

## 2018-01-31 ENCOUNTER — Encounter: Payer: Self-pay | Admitting: Medical Oncology

## 2018-01-31 VITALS — BP 117/53 | HR 86 | Temp 98.6°F | Resp 18 | Wt 204.0 lb

## 2018-01-31 DIAGNOSIS — C7951 Secondary malignant neoplasm of bone: Secondary | ICD-10-CM | POA: Diagnosis present

## 2018-01-31 DIAGNOSIS — C61 Malignant neoplasm of prostate: Secondary | ICD-10-CM

## 2018-01-31 DIAGNOSIS — C7952 Secondary malignant neoplasm of bone marrow: Principal | ICD-10-CM

## 2018-01-31 DIAGNOSIS — Z510 Encounter for antineoplastic radiation therapy: Secondary | ICD-10-CM | POA: Insufficient documentation

## 2018-01-31 NOTE — Progress Notes (Signed)
See progress note under physician encounter. 

## 2018-01-31 NOTE — Progress Notes (Signed)
Kevin Hurley completed radiation radiation to his left hip 11/13/17. He states thing were going well until developed spinal mets with cord compression. He underwent surgery 6/21. He is here to discuss radiation to his T spine. He will restart chemo once he completes radiation. He has been unable to work since his surgery but hopes to return.

## 2018-02-03 NOTE — Addendum Note (Signed)
Encounter addended by: Heywood Footman, RN on: 02/03/2018 10:54 AM  Actions taken: Order Reconciliation Section accessed

## 2018-02-04 ENCOUNTER — Ambulatory Visit
Admission: RE | Admit: 2018-02-04 | Discharge: 2018-02-04 | Disposition: A | Discharge status: Home / Self Care | Payer: 59 | Source: Ambulatory Visit | Attending: Radiation Oncology | Admitting: Radiation Oncology

## 2018-02-04 DIAGNOSIS — C7951 Secondary malignant neoplasm of bone: Secondary | ICD-10-CM | POA: Diagnosis not present

## 2018-02-05 ENCOUNTER — Ambulatory Visit
Admission: RE | Admit: 2018-02-05 | Discharge: 2018-02-05 | Disposition: A | Discharge status: Home / Self Care | Payer: 59 | Source: Ambulatory Visit | Attending: Radiation Oncology | Admitting: Radiation Oncology

## 2018-02-05 DIAGNOSIS — C7951 Secondary malignant neoplasm of bone: Secondary | ICD-10-CM | POA: Diagnosis not present

## 2018-02-06 ENCOUNTER — Ambulatory Visit
Admission: RE | Admit: 2018-02-06 | Discharge: 2018-02-06 | Disposition: A | Discharge status: Home / Self Care | Payer: 59 | Source: Ambulatory Visit | Attending: Radiation Oncology | Admitting: Radiation Oncology

## 2018-02-06 DIAGNOSIS — C7951 Secondary malignant neoplasm of bone: Secondary | ICD-10-CM | POA: Diagnosis not present

## 2018-02-07 ENCOUNTER — Ambulatory Visit
Admission: RE | Admit: 2018-02-07 | Discharge: 2018-02-07 | Disposition: A | Discharge status: Home / Self Care | Payer: 59 | Source: Ambulatory Visit | Attending: Radiation Oncology | Admitting: Radiation Oncology

## 2018-02-07 DIAGNOSIS — C7951 Secondary malignant neoplasm of bone: Secondary | ICD-10-CM | POA: Diagnosis not present

## 2018-02-10 ENCOUNTER — Ambulatory Visit
Admission: RE | Admit: 2018-02-10 | Discharge: 2018-02-10 | Disposition: A | Discharge status: Home / Self Care | Payer: 59 | Source: Ambulatory Visit | Attending: Radiation Oncology | Admitting: Radiation Oncology

## 2018-02-10 DIAGNOSIS — C7951 Secondary malignant neoplasm of bone: Secondary | ICD-10-CM | POA: Diagnosis not present

## 2018-02-11 ENCOUNTER — Ambulatory Visit
Admission: RE | Admit: 2018-02-11 | Discharge: 2018-02-11 | Disposition: A | Discharge status: Home / Self Care | Payer: 59 | Source: Ambulatory Visit | Attending: Radiation Oncology | Admitting: Radiation Oncology

## 2018-02-11 DIAGNOSIS — C7951 Secondary malignant neoplasm of bone: Secondary | ICD-10-CM | POA: Diagnosis not present

## 2018-02-12 ENCOUNTER — Ambulatory Visit
Admission: RE | Admit: 2018-02-12 | Discharge: 2018-02-12 | Disposition: A | Discharge status: Home / Self Care | Payer: 59 | Source: Ambulatory Visit | Attending: Radiation Oncology | Admitting: Radiation Oncology

## 2018-02-12 DIAGNOSIS — C7951 Secondary malignant neoplasm of bone: Secondary | ICD-10-CM | POA: Diagnosis not present

## 2018-02-13 ENCOUNTER — Ambulatory Visit
Admission: RE | Admit: 2018-02-13 | Discharge: 2018-02-13 | Disposition: A | Discharge status: Home / Self Care | Payer: 59 | Source: Ambulatory Visit | Attending: Radiation Oncology | Admitting: Radiation Oncology

## 2018-02-13 DIAGNOSIS — C7951 Secondary malignant neoplasm of bone: Secondary | ICD-10-CM | POA: Diagnosis not present

## 2018-02-14 ENCOUNTER — Ambulatory Visit: Admission: RE | Admit: 2018-02-14 | Payer: 59 | Source: Ambulatory Visit

## 2018-02-17 ENCOUNTER — Ambulatory Visit: Payer: 59

## 2018-02-18 ENCOUNTER — Emergency Department (HOSPITAL_COMMUNITY)
Admission: EM | Admit: 2018-02-18 | Discharge: 2018-02-19 | Disposition: A | Discharge status: Other Acute Inpatient Hospital | Payer: 59 | Attending: Emergency Medicine | Admitting: Emergency Medicine

## 2018-02-18 ENCOUNTER — Emergency Department (HOSPITAL_COMMUNITY): Payer: 59

## 2018-02-18 ENCOUNTER — Encounter (HOSPITAL_COMMUNITY): Payer: Self-pay | Admitting: *Deleted

## 2018-02-18 ENCOUNTER — Ambulatory Visit
Admission: RE | Admit: 2018-02-18 | Discharge: 2018-02-18 | Disposition: A | Discharge status: Home / Self Care | Payer: 59 | Source: Ambulatory Visit | Attending: Radiation Oncology | Admitting: Radiation Oncology

## 2018-02-18 ENCOUNTER — Telehealth: Payer: Self-pay | Admitting: Radiation Oncology

## 2018-02-18 ENCOUNTER — Ambulatory Visit: Payer: 59

## 2018-02-18 ENCOUNTER — Other Ambulatory Visit: Payer: Self-pay

## 2018-02-18 DIAGNOSIS — R29898 Other symptoms and signs involving the musculoskeletal system: Secondary | ICD-10-CM

## 2018-02-18 DIAGNOSIS — C61 Malignant neoplasm of prostate: Secondary | ICD-10-CM

## 2018-02-18 DIAGNOSIS — R600 Localized edema: Secondary | ICD-10-CM | POA: Diagnosis not present

## 2018-02-18 DIAGNOSIS — C7951 Secondary malignant neoplasm of bone: Secondary | ICD-10-CM | POA: Diagnosis not present

## 2018-02-18 DIAGNOSIS — F17210 Nicotine dependence, cigarettes, uncomplicated: Secondary | ICD-10-CM | POA: Diagnosis not present

## 2018-02-18 DIAGNOSIS — D649 Anemia, unspecified: Secondary | ICD-10-CM | POA: Diagnosis not present

## 2018-02-18 DIAGNOSIS — R531 Weakness: Secondary | ICD-10-CM | POA: Diagnosis present

## 2018-02-18 DIAGNOSIS — R609 Edema, unspecified: Secondary | ICD-10-CM

## 2018-02-18 DIAGNOSIS — S24113A Complete lesion at T7-T10 level of thoracic spinal cord, initial encounter: Secondary | ICD-10-CM

## 2018-02-18 LAB — CBC
HEMATOCRIT: 21.8 % — AB (ref 39.0–52.0)
HEMOGLOBIN: 6.7 g/dL — AB (ref 13.0–17.0)
MCH: 27.2 pg (ref 26.0–34.0)
MCHC: 30.7 g/dL (ref 30.0–36.0)
MCV: 88.6 fL (ref 78.0–100.0)
Platelets: 70 10*3/uL — ABNORMAL LOW (ref 150–400)
RBC: 2.46 MIL/uL — AB (ref 4.22–5.81)
RDW: 19 % — ABNORMAL HIGH (ref 11.5–15.5)
WBC: 3.6 10*3/uL — AB (ref 4.0–10.5)

## 2018-02-18 LAB — HEPATIC FUNCTION PANEL
ALT: 16 U/L (ref 0–44)
AST: 51 U/L — ABNORMAL HIGH (ref 15–41)
Albumin: 2.9 g/dL — ABNORMAL LOW (ref 3.5–5.0)
Alkaline Phosphatase: 143 U/L — ABNORMAL HIGH (ref 38–126)
BILIRUBIN DIRECT: 0.2 mg/dL (ref 0.0–0.2)
BILIRUBIN INDIRECT: 0.7 mg/dL (ref 0.3–0.9)
BILIRUBIN TOTAL: 0.9 mg/dL (ref 0.3–1.2)
Total Protein: 5.8 g/dL — ABNORMAL LOW (ref 6.5–8.1)

## 2018-02-18 LAB — BASIC METABOLIC PANEL
ANION GAP: 14 (ref 5–15)
BUN: 15 mg/dL (ref 6–20)
CO2: 26 mmol/L (ref 22–32)
Calcium: 8.9 mg/dL (ref 8.9–10.3)
Chloride: 101 mmol/L (ref 98–111)
Creatinine, Ser: 0.65 mg/dL (ref 0.61–1.24)
GLUCOSE: 111 mg/dL — AB (ref 70–99)
POTASSIUM: 4.4 mmol/L (ref 3.5–5.1)
Sodium: 141 mmol/L (ref 135–145)

## 2018-02-18 LAB — PROTIME-INR
INR: 1.28
PROTHROMBIN TIME: 15.9 s — AB (ref 11.4–15.2)

## 2018-02-18 LAB — PREPARE RBC (CROSSMATCH)

## 2018-02-18 LAB — BRAIN NATRIURETIC PEPTIDE: B Natriuretic Peptide: 20.3 pg/mL (ref 0.0–100.0)

## 2018-02-18 LAB — TROPONIN I: Troponin I: <0.03 ng/mL (ref <0.03)

## 2018-02-18 MED ORDER — HYDROMORPHONE HCL 2 MG/ML IJ SOLN
2.0000 mg | Freq: Once | INTRAMUSCULAR | Status: AC
  Administered 2018-02-18: 2 mg via INTRAVENOUS
  Filled 2018-02-18: qty 1

## 2018-02-18 MED ORDER — ONDANSETRON HCL 4 MG/2ML IJ SOLN
4.0000 mg | Freq: Once | INTRAMUSCULAR | Status: AC
  Administered 2018-02-18: 4 mg via INTRAVENOUS
  Filled 2018-02-18: qty 2

## 2018-02-18 MED ORDER — KETOROLAC TROMETHAMINE 15 MG/ML IJ SOLN
30.0000 mg | Freq: Once | INTRAMUSCULAR | Status: DC
  Filled 2018-02-18: qty 2

## 2018-02-18 MED ORDER — SODIUM CHLORIDE 0.9% IV SOLUTION
Freq: Once | INTRAVENOUS | Status: AC
  Administered 2018-02-19: 02:00:00 via INTRAVENOUS

## 2018-02-18 MED ORDER — HYDROMORPHONE HCL 1 MG/ML IJ SOLN
1.0000 mg | Freq: Once | INTRAMUSCULAR | Status: DC
  Filled 2018-02-18: qty 1

## 2018-02-18 MED ORDER — GABAPENTIN 300 MG PO CAPS
300.0000 mg | ORAL_CAPSULE | Freq: Once | ORAL | Status: AC
  Administered 2018-02-18: 300 mg via ORAL
  Filled 2018-02-18: qty 1

## 2018-02-18 NOTE — Telephone Encounter (Signed)
Phoned patient to inquire about status. Patient reports weakness and pain in his spine has increased such that he requires a walker to get around. Reports edema in legs and feet. Reports he has been unable to reach his neurosurgeon at [facility] despite trying since last Thursday. Reports taking Oxy ER 60 mg TID, Oxy IR 30 mg every 3 hours, prednisone 10 mg once per day, gabapentin, flexeril and cyclobenzaprine to manage his pain. Explained that per Ashlyn Bruning, PA-C we don't want him to miss another treatment since he has already missed to consecutive treatments. Went onto explain that missing another treatment could negatively effect the overall success of the treatments. Patient verbalized understanding. Patient expressed his intent to present for treatment today. Patient reports his current pain level is 5 on a scale of 0-10. Phoned [facility] and spoke with Anderson Malta, RT. Caesar Bookman, RT that the patient plans to present for treatment and we should do anything possible to ensure he receives treatment. She verbalized understanding.

## 2018-02-18 NOTE — ED Triage Notes (Signed)
Pt came in today with c/o lower extremity swelling, right eye swelling, dental pain and increased weakness (he has had to use a walker today to help him walk). Pt is receiving radiation for met prostate cancer and had a spinal fusion in June at [facility].

## 2018-02-18 NOTE — ED Provider Notes (Addendum)
[facility] DEPT Provider Note   CSN: [id number] Arrival date & time: 02/18/18  1930     History   Chief Complaint Chief Complaint  Patient presents with  . Weakness    HPI Kevin Hurley is a 47 y.o. male.  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  Patient's primary complaint is increasing bilateral lower extremity weakness.  He states he is able to move them and has had no numbness, but is incredibly weak.  He has had to resort to using a walker for the last 2 days.  Over this time.,  He is also had significant increased bilateral lower extremity edema which is new.  He was recently hospitalized and underwent thoracic back surgery.  He states his back pain has been persistent since surgery, but has not acutely worsened.  Denies any loss of bowel or bladder function beyond his baseline.  Denies any perianal anesthesia.  He has not had any fevers.  Patient also incidentally notices a left upper molar tooth pain.  This just began today.  Denies any drainage.  No fever.  He has had some mild facial edema over the last 24 hours.  No history of heart failure.  Denies any blood loss.  His pain is severe, localized in his thoracic spine, and worse with movement and lying flat.  Past Medical History:  Diagnosis Date  . Prostate cancer ([place])   . Sciatica   . Urethral stricture     Patient Active Problem List   Diagnosis Date Noted  . Prostate cancer metastatic to bone [facility]) 10/25/2017    Past Surgical History:  Procedure Laterality Date  . PROSTATE BIOPSY          Home Medications    Prior to Admission medications   Medication Sig Start Date End Date Taking? Authorizing Provider  cyclobenzaprine (FLEXERIL) 5 MG tablet Take 5 mg by mouth 3 (three) times daily.  01/07/18  Yes [provider]  gabapentin (NEURONTIN) 300 MG capsule Take 600 mg by mouth 2 (two)  times daily.  01/18/18 02/18/18 Yes [provider]  loratadine (CLARITIN) 10 MG tablet Take 10 mg by mouth daily.   Yes [provider]  oxyCODONE (OXYCONTIN) 60 MG 12 hr tablet Take 60 mg by mouth 3 (three) times daily.   Yes [provider]  oxycodone (ROXICODONE) 30 MG immediate release tablet Take 30-60 mg by mouth every 2 (two) hours as needed for pain.   Yes [provider]  predniSONE (DELTASONE) 10 MG tablet TAKE 1 TABLET BY MOUTH EVERY DAY 12/12/17  Yes [provider]  ranitidine (ZANTAC) 150 MG tablet Take 150 mg by mouth 2 (two) times daily.  06/28/17 06/28/18 Yes [provider]  senna-docusate (SENOKOT-S) 8.6-50 MG tablet Take 2 tablets by mouth 2 (two) times daily.  01/18/18 01/18/19 Yes [provider]  tamsulosin (FLOMAX) 0.4 MG CAPS capsule TAKE ONE CAPSULE BY MOUTH TWICE A DAY AFTER SAME MEAL EACH DAY 01/27/18  Yes [provider]    Family History Family History  Problem Relation Age of Onset  . Cancer Father        lung cancer/quit smoking numerous years before diagnosis  . Healthy Brother   . Cancer Maternal Aunt        brain cancer  . Cancer Maternal Grandfather        throat cancer/smoker    Social History Social History  Tobacco Use  . Smoking status: Current Every Day Smoker    Packs/day: 1.00    Years: 20.00    Pack years: 20.00    Types: Cigarettes  . Smokeless tobacco: Never Used  Substance Use Topics  . Alcohol use: Not Currently  . Drug use: No     Allergies   Other   Review of Systems Review of Systems  Constitutional: Positive for fatigue. Negative for chills and fever.  HENT: Positive for dental problem. Negative for congestion and rhinorrhea.   Eyes: Negative for visual disturbance.  Respiratory: Negative for cough, shortness of breath and wheezing.   Cardiovascular: Positive for leg swelling. Negative for chest pain.  Gastrointestinal: Negative for abdominal pain,  diarrhea, nausea and vomiting.  Genitourinary: Negative for dysuria and flank pain.  Musculoskeletal: Positive for back pain and gait problem. Negative for neck pain and neck stiffness.  Skin: Negative for rash and wound.  Allergic/Immunologic: Negative for immunocompromised state.  Neurological: Positive for weakness. Negative for syncope and headaches.  All other systems reviewed and are negative.    Physical Exam Updated Vital Signs BP (!) 123/57 (BP Location: Left Arm)   Pulse 99   Temp 98.4 F (36.9 C) (Oral)   Resp (!) 24   Ht 5\' 11"  (1.803 m)   Wt 93 kg (205 lb)   SpO2 92%   BMI 28.59 kg/m   Physical Exam  Constitutional: He is oriented to person, place, and time. He appears well-developed and well-nourished. He has a sickly appearance. No distress.  HENT:  Head: Normocephalic and atraumatic.  Diffuse dental caries. TTP over left upper molar, no appreciable gingival abscess. No sublingual or submandibular swelling. Mild periorbital edema b/l without erythema.  Eyes: Conjunctivae are normal.  Neck: Neck supple.  Kyphosis  Cardiovascular: Normal rate, regular rhythm and normal heart sounds. Exam reveals no friction rub.  No murmur heard. Pulmonary/Chest: Effort normal. No respiratory distress. He has no wheezes. He has rales.  Abdominal: He exhibits no distension.  Musculoskeletal: He exhibits edema (3+ pitting edema b/l LE).  Neurological: He is alert and oriented to person, place, and time. He exhibits normal muscle tone.  Skin: Skin is warm. Capillary refill takes less than 2 seconds.  Psychiatric: He has a normal mood and affect.  Nursing note and vitals reviewed.   Spine Exam: Inspection/Palpation: Recent thoracic spine surgery site c/d/i. No fluctuance, erythema, or dehiscence. Diffuse spinal TTP throughout T, upper L spine. Strength: 5/5 throughout LE bilaterally (hip flexion/extension, adduction/abduction; knee flexion/extension; foot  dorsiflexion/plantarflexion, inversion/eversion; great toe inversion) Sensation: Intact to light touch in proximal and distal LE bilaterally   ED Treatments / Results  Labs (all labs ordered are listed, but only abnormal results are displayed) Labs Reviewed  BASIC METABOLIC PANEL - Abnormal; Notable for the following components:      Result Value   Glucose, Bld 111 (*)    All other components within normal limits  CBC - Abnormal; Notable for the following components:   WBC 3.6 (*)    RBC 2.46 (*)    Hemoglobin 6.7 (*)    HCT 21.8 (*)    RDW 19.0 (*)    Platelets 70 (*)    All other components within normal limits  HEPATIC FUNCTION PANEL - Abnormal; Notable for the following components:   Total Protein 5.8 (*)    Albumin 2.9 (*)    AST 51 (*)    Alkaline Phosphatase 143 (*)    All other components within  normal limits  PROTIME-INR - Abnormal; Notable for the following components:   Prothrombin Time 15.9 (*)    All other components within normal limits  BRAIN NATRIURETIC PEPTIDE  TROPONIN I  URINALYSIS, ROUTINE W REFLEX MICROSCOPIC  VITAMIN B12  FOLATE  IRON AND TIBC  FERRITIN  RETICULOCYTES  PREPARE RBC (CROSSMATCH)  TYPE AND SCREEN    EKG None  Radiology Dg Chest 2 View  Result Date: 02/18/2018 CLINICAL DATA:  Weakness.  History of metastatic prostate cancer. EXAM: CHEST - 2 VIEW COMPARISON:  None. FINDINGS: The heart size and mediastinal contours are within normal limits. No pneumothorax is noted. Right lung is clear. Mild left basilar atelectasis or infiltrate is noted with small left pleural effusion. The visualized skeletal structures are unremarkable. IMPRESSION: Mild left basilar atelectasis or infiltrate is noted with small left pleural effusion. Electronically Signed   By: Marijo Conception, M.D.   On: 02/18/2018 22:18    Procedures .Critical Care Performed by: Duffy Bruce, MD Authorized by: Duffy Bruce, MD   Critical care provider statement:     Critical care time (minutes):  35   Critical care time was exclusive of:  Separately billable procedures and treating other patients and teaching time   Critical care was necessary to treat or prevent imminent or life-threatening deterioration of the following conditions:  Circulatory failure and cardiac failure   Critical care was time spent personally by me on the following activities:  Development of treatment plan with patient or surrogate, discussions with consultants, evaluation of patient's response to treatment, examination of patient, obtaining history from patient or surrogate, ordering and performing treatments and interventions, ordering and review of laboratory studies, ordering and review of radiographic studies, pulse oximetry, re-evaluation of patient's condition and review of old charts   I assumed direction of critical care for this patient from another provider in my specialty: no     (including critical care time)  Medications Ordered in ED Medications  0.9 %  sodium chloride infusion (Manually program via Guardrails IV Fluids) (has no administration in time range)  ondansetron (ZOFRAN) injection 4 mg (4 mg Intravenous Given 02/18/18 2250)  HYDROmorphone (DILAUDID) injection 2 mg (2 mg Intravenous Given 02/18/18 2250)  gabapentin (NEURONTIN) capsule 300 mg (300 mg Oral Given 02/18/18 2250)     Initial Impression / Assessment and Plan / ED Course  I have reviewed the triage vital signs and the nursing notes.  Pertinent labs & imaging results that were available during my care of the patient were reviewed by me and considered in my medical decision making (see chart for details).     47 yo M with history of metastatic prostate CA with known diffuse bony mets, s/p recent T11-L1 laminectomy by Dr. Aris Lot at [facility], followed by Dr. Tyrone Nine and Aline Brochure at [facility], here with bilateral leg swelling, weakness. Concern for weakness 2/2 recurrent thoracolumbar disease, possibly cord  compression. Neurologically intact distally however, no clinical signs of cauda equina. Otherwise, DDx includes generalized weakness 2/2 CHF, hepatic failure, possibly high-output cardiac failure 2/2 anemia. Will check labs, MRI. Will need duplex U/S once available. No clinical signs of PE.  Labs reviewed. Pt with acute on chronic anemia, also pancytopenia. Interestingly, has not started chemo yet - will transfuse. Anemia panel sent prior to transfusion. Otherwise, MRI pending. Labs otherwise are largely unremarkable, ruling out liver failure, cardiac failure. DVT, hypoalbuminemia causing third spacing remain on DDx for swelling.  Current plan is to f/u MRI. If negative, can  likely admit here with transfusions, DVT study once able. If positive or concerning, will need to contact [facility] (Dr. Aris Lot) for transfer and further management.  Patient care transferred to Dr. Stark Jock at the end of my shift. Patient presentation, ED course, and plan of care discussed with review of all pertinent labs and imaging. Please see his/her note for further details regarding further ED course and disposition.  Final Clinical Impressions(s) / ED Diagnoses   Final diagnoses:  Acute on chronic anemia  Weakness of both lower extremities  Pitting edema    ED Discharge Orders    None       Duffy Bruce, MD 02/19/18 Sylvester Harder    Duffy Bruce, MD 03/04/18 [phone]

## 2018-02-19 ENCOUNTER — Telehealth: Payer: Self-pay | Admitting: Radiation Oncology

## 2018-02-19 ENCOUNTER — Ambulatory Visit: Payer: 59

## 2018-02-19 DIAGNOSIS — D649 Anemia, unspecified: Secondary | ICD-10-CM | POA: Diagnosis not present

## 2018-02-19 DIAGNOSIS — F17210 Nicotine dependence, cigarettes, uncomplicated: Secondary | ICD-10-CM | POA: Diagnosis not present

## 2018-02-19 DIAGNOSIS — R600 Localized edema: Secondary | ICD-10-CM | POA: Diagnosis not present

## 2018-02-19 DIAGNOSIS — R531 Weakness: Secondary | ICD-10-CM | POA: Diagnosis present

## 2018-02-19 LAB — RETICULOCYTES
RBC.: 2.25 MIL/uL — ABNORMAL LOW (ref 4.22–5.81)
Retic Count, Absolute: 69.8 10*3/uL (ref 19.0–186.0)
Retic Ct Pct: 3.1 % (ref 0.4–3.1)

## 2018-02-19 LAB — FOLATE: FOLATE: 15.8 ng/mL (ref >5.9)

## 2018-02-19 LAB — URINALYSIS, ROUTINE W REFLEX MICROSCOPIC
Bilirubin Urine: NEGATIVE
GLUCOSE, UA: NEGATIVE mg/dL
HGB URINE DIPSTICK: NEGATIVE
Ketones, ur: NEGATIVE mg/dL
LEUKOCYTES UA: NEGATIVE
Nitrite: NEGATIVE
PROTEIN: NEGATIVE mg/dL
SPECIFIC GRAVITY, URINE: 1.017 (ref 1.005–1.030)
pH: 6 (ref 5.0–8.0)

## 2018-02-19 LAB — IRON AND TIBC
Iron: 81 ug/dL (ref 45–182)
Saturation Ratios: 34 % (ref 17.9–39.5)
TIBC: 239 ug/dL — ABNORMAL LOW (ref 250–450)
UIBC: 158 ug/dL

## 2018-02-19 LAB — VITAMIN B12: Vitamin B-12: 223 pg/mL (ref 180–914)

## 2018-02-19 LAB — FERRITIN: Ferritin: 2121 ng/mL — ABNORMAL HIGH (ref 24–336)

## 2018-02-19 LAB — ABO/RH: ABO/RH(D): O POS

## 2018-02-19 MED ORDER — GENERIC EXTERNAL MEDICATION
Status: DC
Start: ? — End: 2018-02-19

## 2018-02-19 MED ORDER — DEXAMETHASONE SODIUM PHOSPHATE 10 MG/ML IJ SOLN
10.0000 mg | Freq: Once | INTRAMUSCULAR | Status: AC
  Administered 2018-02-19: 10 mg via INTRAVENOUS
  Filled 2018-02-19: qty 1

## 2018-02-19 MED ORDER — GENERIC EXTERNAL MEDICATION
1.00 | Status: DC
Start: ? — End: 2018-02-19

## 2018-02-19 MED ORDER — NALOXONE HCL 0.4 MG/ML IJ SOLN
0.40 | INTRAMUSCULAR | Status: DC
Start: ? — End: 2018-02-19

## 2018-02-19 MED ORDER — DEXAMETHASONE SODIUM PHOSPHATE 4 MG/ML IJ SOLN
4.00 | INTRAMUSCULAR | Status: DC
Start: 2018-02-27 — End: 2018-02-19

## 2018-02-19 MED ORDER — LIDOCAINE HCL (PF) 1 % IJ SOLN
0.50 | INTRAMUSCULAR | Status: DC
Start: ? — End: 2018-02-19

## 2018-02-19 MED ORDER — DEXTROSE 50 % IV SOLN
12.50 | INTRAVENOUS | Status: DC
Start: ? — End: 2018-02-19

## 2018-02-19 MED ORDER — METHOCARBAMOL 1000 MG/10ML IJ SOLN
500.0000 mg | Freq: Once | INTRAVENOUS | Status: AC
  Administered 2018-02-19: 500 mg via INTRAVENOUS
  Filled 2018-02-19: qty 550

## 2018-02-19 MED ORDER — HYDROCORTISONE NA SUCCINATE PF 100 MG IJ SOLR
25.00 | INTRAMUSCULAR | Status: DC
Start: 2018-02-27 — End: 2018-02-19

## 2018-02-19 MED ORDER — LORAZEPAM 2 MG/ML IJ SOLN
1.0000 mg | Freq: Once | INTRAMUSCULAR | Status: AC
  Administered 2018-02-19: 1 mg via INTRAVENOUS
  Filled 2018-02-19: qty 1

## 2018-02-19 MED ORDER — SODIUM CHLORIDE 0.9 % IV SOLN
INTRAVENOUS | Status: DC
Start: ? — End: 2018-02-19

## 2018-02-19 MED ORDER — METHOCARBAMOL 1000 MG/10ML IJ SOLN: 500.0000 mg | Freq: Once | INTRAMUSCULAR | Status: DC

## 2018-02-19 MED ORDER — INSULIN REGULAR HUMAN 100 UNIT/ML IJ SOLN
.00 | INTRAMUSCULAR | Status: DC
Start: 2018-02-19 — End: 2018-02-19

## 2018-02-19 MED ORDER — GENERIC EXTERNAL MEDICATION
40.00 | Status: DC
Start: 2018-02-20 — End: 2018-02-19

## 2018-02-19 NOTE — ED Provider Notes (Signed)
Care assumed from Dr. Ellender Hose at shift change.  Patient with history of metastatic prostate cancer status post recent lumbar spine fusion performed at [facility].  He presents today with increased difficulty walking over the past several days along with lower extremity swelling.  His work-up reveals anemia with a hemoglobin of 6.7.  Orders for transfusion were placed by Dr. Ellender Hose.  I assumed care awaiting results of an MRI of his thoracic and lumbar spine.  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  This finding was discussed with Dr. Dennison Nancy from [facility] oncology.  She is requesting the patient be transferred there for further work-up and treatment.  To my exam, sensation is intact throughout both lower extremities.  Patellar reflexes 1+ in the left knee and trace in the right.  Strength is 4+ out of 5 in both lower extremities.   Veryl Speak, MD 02/19/18 [phone]

## 2018-02-19 NOTE — Telephone Encounter (Signed)
Opened in error

## 2018-02-19 NOTE — Telephone Encounter (Signed)
Team.   Cyndi Bender went to the [facility] emergency room last night with weakness. The short story is that imaging was done and he was transferred to [facility] via [place]. The neurosurgeon at [facility] assessed him earlier today but declined to operate. I received a call from [facility] in the radiation oncology department at [facility]. She is uncertain if the patient will be treated there or transferred back here for further treatment. In the event the patient is treated at [facility] is trying to prepare by obtaining records. She request our radiation oncology records and screen shots of our treatment plans. Juliann Pulse you should receive a medical record request fax from [facility] on your fax [phone]. Waunita Schooner would you be so kind to send the screen shots? Dana's email is [email or web address].  Thank you all.   Heywood Footman, RN 3, BSN, [facility]  Radiation Oncology  [phone] (phone) [phone] (fax)

## 2018-02-19 NOTE — ED Notes (Signed)
Called Neurosurgery at [facility].@0100  for Dr Stark Jock

## 2018-02-19 NOTE — ED Notes (Signed)
Report called to receiving RN and [facility].

## 2018-02-19 NOTE — ED Notes (Signed)
Called Neurosurgery at [facility].@0100  for Dr Stark Jock

## 2018-02-19 NOTE — ED Notes (Signed)
Patient transported to [facility] by [facility] after first unit of blood infused-patient has lower extremity weakness with left lower extremity weaker than right.

## 2018-02-20 ENCOUNTER — Ambulatory Visit: Payer: 59

## 2018-02-21 ENCOUNTER — Ambulatory Visit: Payer: 59

## 2018-02-21 MED ORDER — SENNOSIDES-DOCUSATE SODIUM 8.6-50 MG PO TABS
2.00 | ORAL_TABLET | ORAL | Status: DC
Start: 2018-02-28 — End: 2018-02-21

## 2018-02-21 MED ORDER — GABAPENTIN 400 MG PO CAPS
800.00 | ORAL_CAPSULE | ORAL | Status: DC
Start: 2018-02-27 — End: 2018-02-21

## 2018-02-21 MED ORDER — OXYCODONE HCL 5 MG PO TABS
30.00 | ORAL_TABLET | ORAL | Status: DC
Start: ? — End: 2018-02-21

## 2018-02-21 MED ORDER — PANTOPRAZOLE SODIUM 40 MG PO TBEC
40.00 | DELAYED_RELEASE_TABLET | ORAL | Status: DC
Start: 2018-02-28 — End: 2018-02-21

## 2018-02-21 MED ORDER — HYDROMORPHONE HCL-NACL 25-0.9 MG/25ML-% IV SOSY
1.50 | PREFILLED_SYRINGE | INTRAVENOUS | Status: DC
Start: ? — End: 2018-02-21

## 2018-02-21 MED ORDER — ONDANSETRON HCL 4 MG/2ML IJ SOLN
4.00 | INTRAMUSCULAR | Status: DC
Start: ? — End: 2018-02-21

## 2018-02-21 MED ORDER — GENERIC EXTERNAL MEDICATION
Status: DC
Start: ? — End: 2018-02-21

## 2018-02-21 MED ORDER — CALCIUM CARBONATE ANTACID 750 MG PO CHEW
CHEWABLE_TABLET | ORAL | Status: DC
Start: ? — End: 2018-02-21

## 2018-02-21 MED ORDER — LIDOCAINE HCL (PF) 1 % IJ SOLN
0.50 | INTRAMUSCULAR | Status: DC
Start: ? — End: 2018-02-21

## 2018-02-21 MED ORDER — POLYETHYLENE GLYCOL 3350 17 G PO PACK
17.00 | PACK | ORAL | Status: DC
Start: 2018-02-28 — End: 2018-02-21

## 2018-02-21 MED ORDER — SODIUM CHLORIDE 0.9 % IV SOLN
INTRAVENOUS | Status: DC
Start: ? — End: 2018-02-21

## 2018-02-21 MED ORDER — NALOXONE HCL 0.4 MG/ML IJ SOLN
0.40 | INTRAMUSCULAR | Status: DC
Start: ? — End: 2018-02-21

## 2018-02-21 MED ORDER — INSULIN REGULAR HUMAN 100 UNIT/ML IJ SOLN
0.00 | INTRAMUSCULAR | Status: DC
Start: 2018-02-21 — End: 2018-02-21

## 2018-02-21 MED ORDER — CYCLOBENZAPRINE HCL 10 MG PO TABS
5.00 | ORAL_TABLET | ORAL | Status: DC
Start: 2018-02-28 — End: 2018-02-21

## 2018-02-21 MED ORDER — LEVETIRACETAM 500 MG PO TABS
1000.00 | ORAL_TABLET | ORAL | Status: DC
Start: 2018-02-27 — End: 2018-02-21

## 2018-02-21 MED ORDER — OXYCODONE HCL ER 80 MG PO T12A
80.00 | EXTENDED_RELEASE_TABLET | ORAL | Status: DC
Start: 2018-02-21 — End: 2018-02-21

## 2018-02-21 MED ORDER — GENERIC EXTERNAL MEDICATION
.15 | Status: DC
Start: ? — End: 2018-02-21

## 2018-02-22 LAB — TYPE AND SCREEN
ABO/RH(D): O POS
Antibody Screen: NEGATIVE
Unit division: 0
Unit division: 0

## 2018-02-22 LAB — BPAM RBC
BLOOD PRODUCT EXPIRATION DATE: 201908172359
Blood Product Expiration Date: 201908172359
ISSUE DATE / TIME: 201907240159
UNIT TYPE AND RH: 5100
Unit Type and Rh: 5100

## 2018-02-24 ENCOUNTER — Ambulatory Visit: Payer: 59

## 2018-02-24 MED ORDER — OXYCODONE HCL ER 80 MG PO T12A
80.00 | EXTENDED_RELEASE_TABLET | ORAL | Status: DC
Start: 2018-02-28 — End: 2018-02-24

## 2018-02-24 MED ORDER — GENERIC EXTERNAL MEDICATION
Status: DC
Start: ? — End: 2018-02-24

## 2018-02-24 MED ORDER — NYSTATIN 100000 UNIT/ML MT SUSP
5.00 | OROMUCOSAL | Status: DC
Start: 2018-02-27 — End: 2018-02-24

## 2018-02-24 MED ORDER — ACETAMINOPHEN 325 MG PO TABS
650.00 | ORAL_TABLET | ORAL | Status: DC
Start: 2018-02-28 — End: 2018-02-24

## 2018-02-25 ENCOUNTER — Ambulatory Visit: Payer: 59

## 2018-02-25 ENCOUNTER — Encounter: Payer: Self-pay | Admitting: Radiation Oncology

## 2018-02-25 MED ORDER — LIDOCAINE HCL 1 % IJ SOLN
3.00 | INTRAMUSCULAR | Status: DC
Start: ? — End: 2018-02-25

## 2018-02-25 MED ORDER — HYDROMORPHONE HCL-NACL 25-0.9 MG/25ML-% IV SOSY
1.50 | PREFILLED_SYRINGE | INTRAVENOUS | Status: DC
Start: ? — End: 2018-02-25

## 2018-02-26 ENCOUNTER — Ambulatory Visit: Payer: 59

## 2018-02-27 MED ORDER — LORAZEPAM 1 MG PO TABS
1.00 | ORAL_TABLET | ORAL | Status: DC
Start: ? — End: 2018-02-27

## 2018-02-27 MED ORDER — GENERIC EXTERNAL MEDICATION
Status: DC
Start: ? — End: 2018-02-27

## 2018-02-28 NOTE — Progress Notes (Signed)
  Radiation Oncology         [phone] ________________________________  Name: Kevin Hurley MRN: [id number]  Date: 02/25/2018  DOB: 1970-10-10  End of Treatment Note  Diagnosis:   47 y.o. 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   Beams/energy:   Photon using a 3D technique with 15X  Narrative: The patient tolerated radiation treatment relatively well. His weight and vitals remained stable. He reported having mid to low back pain, 8 on a scale of 10 aggravated by having to lay in a supine position for radiation treatment while still recovering from a spinal fusion.  He experienced edema in bilateral ankles, right ankle had more edema than the left and occasional tingling in feet, present since spinal fusion and unchanged throughout his course of treatment.  He did have episodes of diarrhea and fatigue but denied having nausea or vomiting.   Plan: The patient has completed radiation treatment. The patient will return to radiation oncology clinic for routine followup in one month. I advised him to call or return sooner if he has any questions or concerns related to his recovery or treatment. ________________________________  Sheral Apley. Tammi Klippel, M.D.   This document serves as a record of services personally performed by Tyler Pita MD. It was created on his behalf by Delton Coombes, a trained medical scribe. The creation of this record is based on the scribe's personal observations and the provider's statements to them.

## 2018-03-20 ENCOUNTER — Ambulatory Visit: Payer: 59 | Admitting: Urology

## 2018-03-30 DEATH — deceased

## 2020-07-13 IMAGING — MR MR LUMBAR SPINE W/O CM
4 of 11 series · 18 of 48 positions shown · non-contrast
Comparison: CT chest abdomen pelvis 10/11/2017

CLINICAL DATA: Spinal metastases and back pain

EXAM:
MRI THORACIC AND LUMBAR SPINE WITHOUT CONTRAST
TECHNIQUE: Multiplanar and multiecho pulse sequences of the thoracic and lumbar
spine were obtained without intravenous contrast.

[Series 7: T2 · sagittal · 3.0mm · 0.64mm/px · 2 of 14 slices shown (1 of 4)]
[im 1/14]
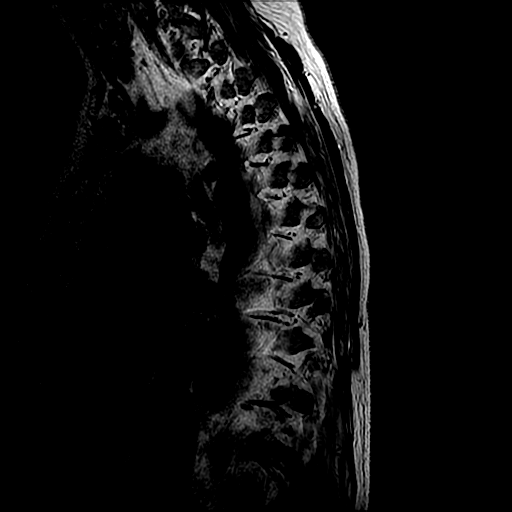
[im 14/14]
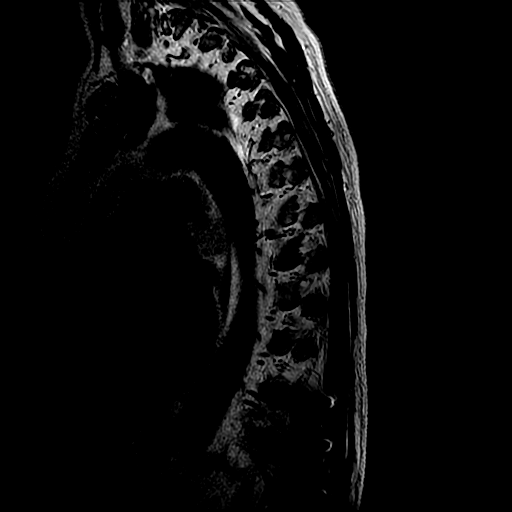

[Series 9: T2 · axial · 4.0mm · 0.39mm/px · z∈[-293,-41]mm · 9 of 50 slices shown (2 of 4)]
[im 1/50]
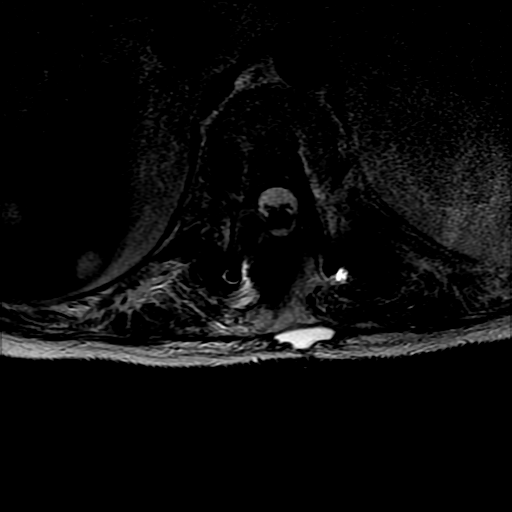
[im 7/50]
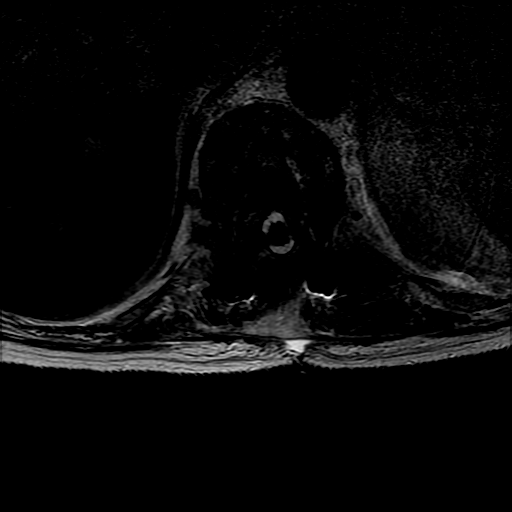
[im 13/50]
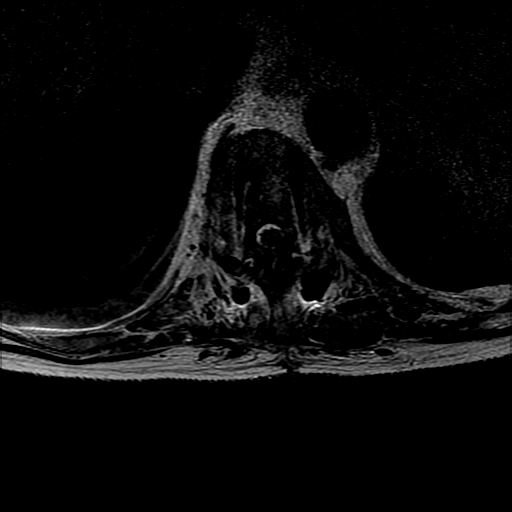
[im 19/50]
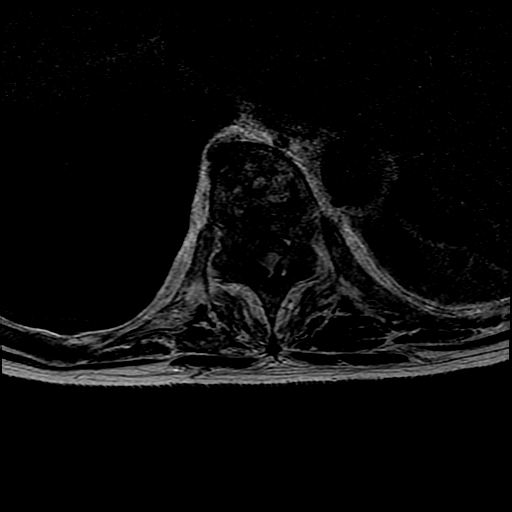
[im 25/50]
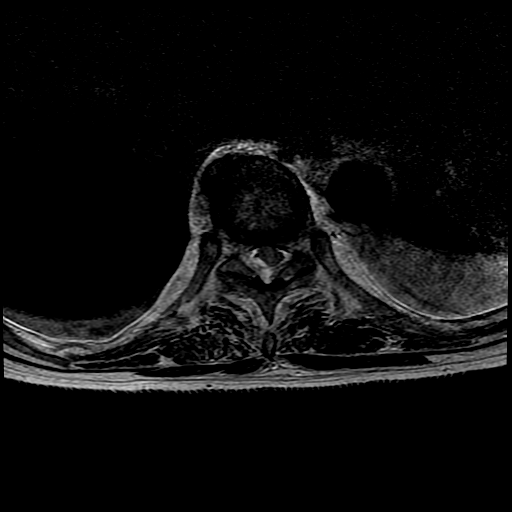
[im 31/50]
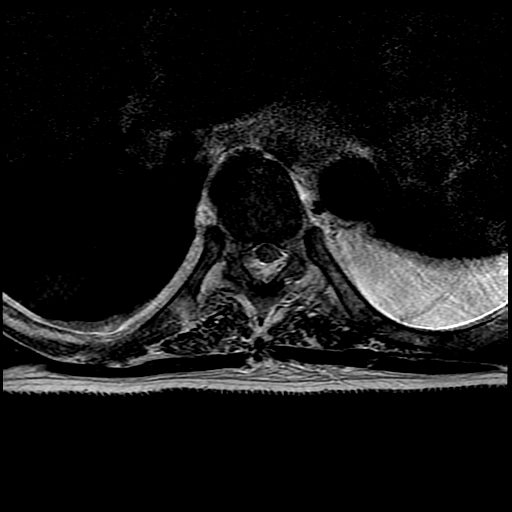
[im 37/50]
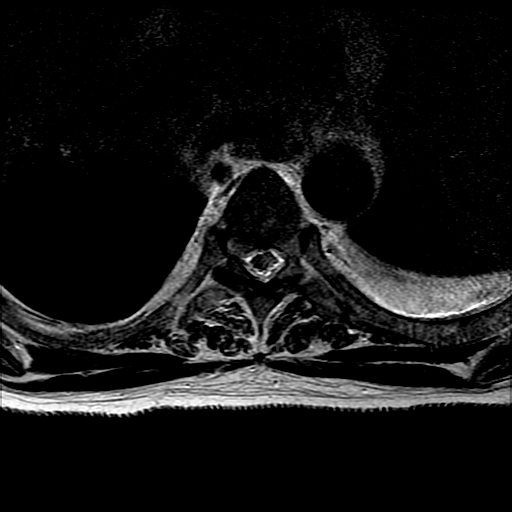
[im 43/50]
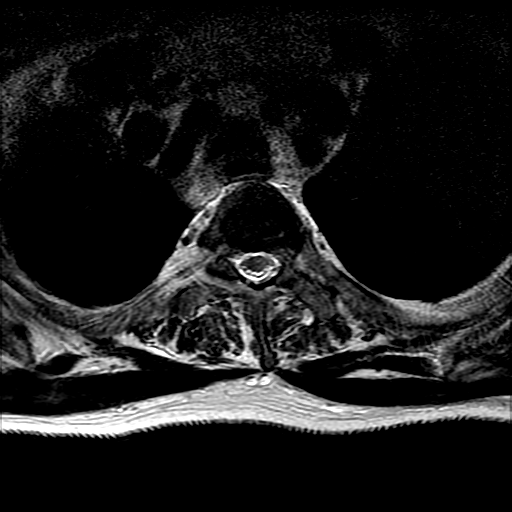
[im 50/50]
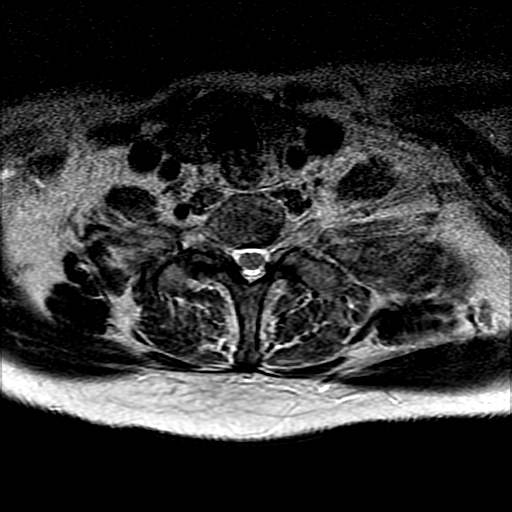

[Series 13: T2 · sagittal · 4.0mm · 0.51mm/px · 2 of 13 slices shown (3 of 4)]
[im 1/13]
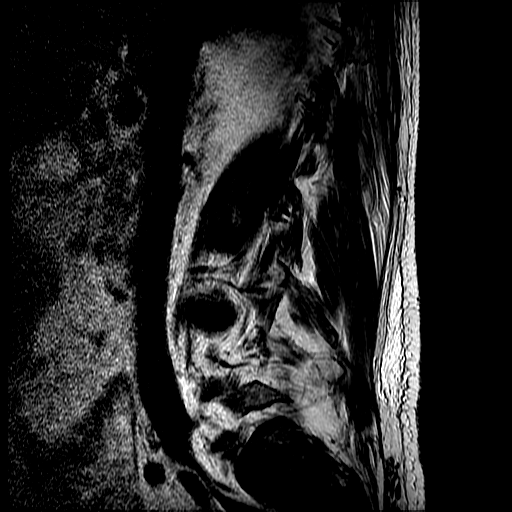
[im 13/13]
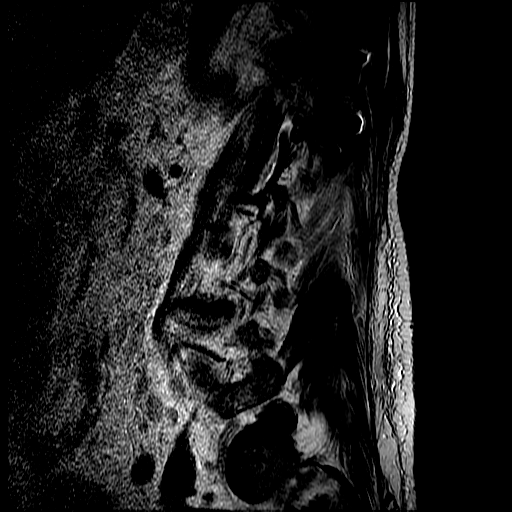

[Series 15: T2 · axial · 4.0mm · 0.39mm/px · z∈[-497,-316]mm · 5 of 42 slices shown (4 of 4)]
[im 1/42]
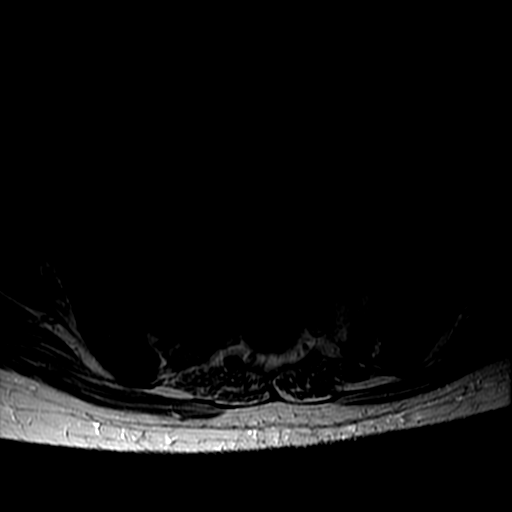
[im 7/42]
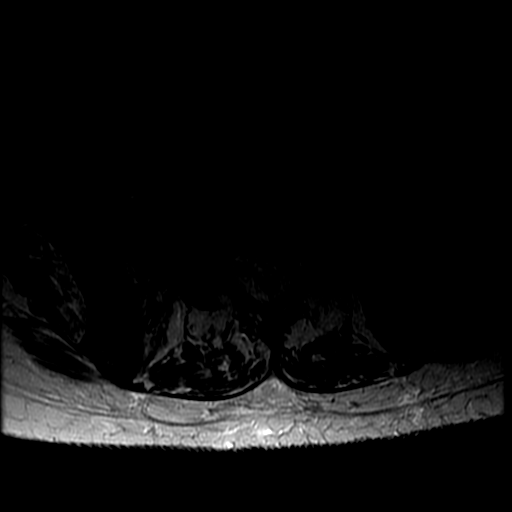
[im 14/42]
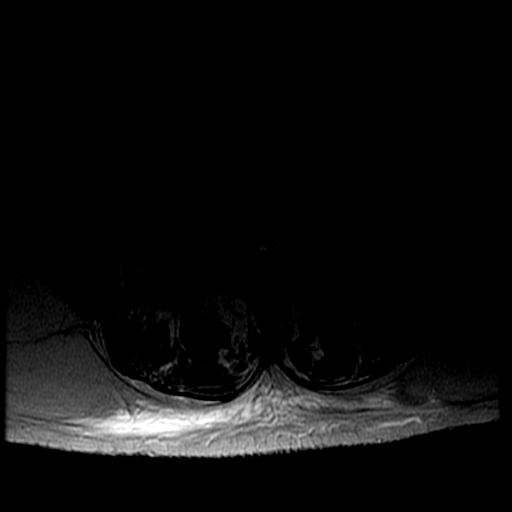
[im 21/42]
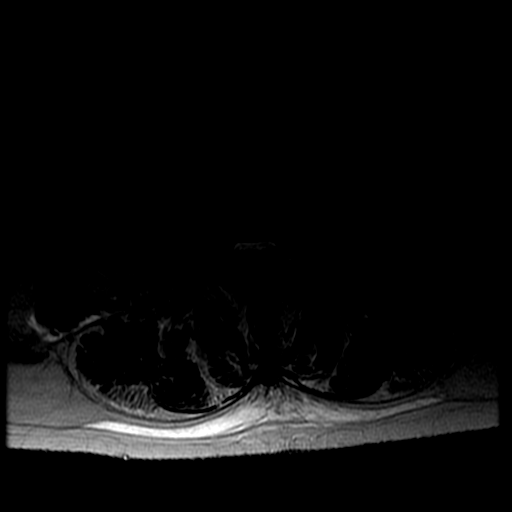
[im 35/42]
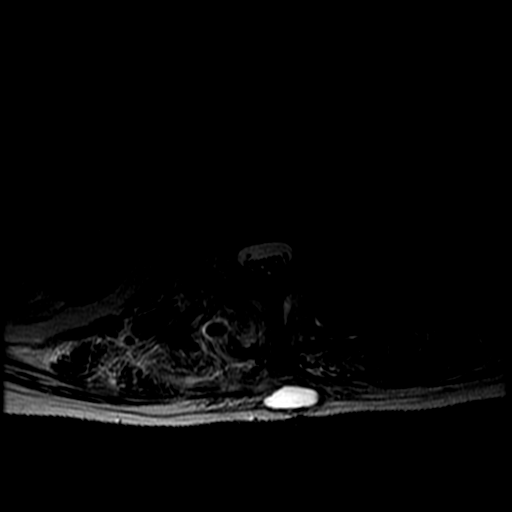

[18 of 48 positions shown; findings below may reference images not displayed]

FINDINGS: MRI THORACIC SPINE FINDINGS

Alignment:  Physiologic.

Vertebrae: There is T11-L1 posterior spinal fusion. There is a
lesion within the T12 vertebral body, as previously demonstrated by
CT. No other focal marrow lesions of the thoracic spine are
identified.

Cord: There is epidural soft tissue along the dorsal spinal canal at
the T8-T10 levels, measuring up to 10 mm in thickness. This effaces
the thecal sac and deforms the spinal cord.

Paraspinal and other soft tissues: There are small pleural
effusions, left larger than right. Small amount of dorsal soft
tissue fluid, probably post-operative.

Disc levels:

Small T3-4 disc herniation without stenosis. Aside from the
above-described thecal sac attenuation caused by the dorsal epidural
metastatic disease, there is no spinal canal stenosis.

MRI LUMBAR SPINE FINDINGS

Segmentation:  Normal

Alignment:  Normal

Vertebrae: There is diffusely low T1-weighted signal throughout the
lumbar spine. There are focal marrow lesions at S1, L5 and L3,
corresponding to the osseous lesions demonstrated on prior CT.

Conus medullaris and cauda equina: Conus extends to the L1 level.
Conus and cauda equina appear normal.

Paraspinal and other soft tissues: Small dorsal fluid collection at
the L1 level.

Disc levels:

T12-L1: No stenosis.

L1-L2: Normal.

L2-L3: Normal.

L3-L4: Large disc bulge and endplate osteophytes. Moderate spinal
canal stenosis. Mild right and moderate left neural foraminal
stenosis.

L4-L5: Large disc bulge with narrowing of both lateral recesses and
moderate spinal canal stenosis. No neural foraminal stenosis.

L5-S1: Small disc bulge without stenosis.

Visualized sacrum: Diffusely abnormal marrow signal throughout the
sacrum.
IMPRESSION: MR THORACIC SPINE IMPRESSION

1. Epidural soft tissue mass along the dorsal spinal canal at T8-10,
consistent with epidural metastatic disease. This effaces the thecal
sac and deforms the spinal cord.
2. Status post T11-L1 instrument posterior spinal fusion.
Redemonstration of T12 metastasis. PLIF

MR LUMBAR SPINE IMPRESSION

1. Redemonstration of metastatic lesions at L3, L5 and throughout
the sacrum.
2. No epidural soft tissue mass of the lumbar spine.
3. Moderate spinal canal stenosis at L3-4 and L4-5 with moderate
left L3 foraminal stenosis.
